# Patient Record
Sex: Male | Born: 1990 | Race: White | Hispanic: No | Marital: Single | Smoking: Never smoker
Health system: Southern US, Community
[De-identification: ages and names within clinical notes are randomized; demographics above are authoritative.]

---
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
## 2009-04-21 ENCOUNTER — Ambulatory Visit: Payer: Self-pay | Admitting: Diagnostic Radiology

## 2009-04-21 ENCOUNTER — Observation Stay (HOSPITAL_COMMUNITY): Admission: RE | Admit: 2009-04-21 | Discharge: 2009-04-22 | Payer: Self-pay | Admitting: Surgery

## 2009-04-21 ENCOUNTER — Encounter: Payer: Self-pay | Admitting: Emergency Medicine

## 2009-04-21 ENCOUNTER — Encounter (INDEPENDENT_AMBULATORY_CARE_PROVIDER_SITE_OTHER): Payer: Self-pay | Admitting: Surgery

## 2009-11-26 ENCOUNTER — Ambulatory Visit: Payer: Self-pay | Admitting: Family

## 2009-11-26 DIAGNOSIS — R109 Unspecified abdominal pain: Secondary | ICD-10-CM

## 2010-04-09 NOTE — Assessment & Plan Note (Signed)
Summary: new to est pain abd pain may be hernia bcbs/mhf--Rm 5   Vital Signs:  Patient profile:   20 year old male Height:      72.5 inches Weight:      188 pounds BMI:     25.24 Temp:     98.1 degrees F oral Pulse rate:   84 / minute Pulse rhythm:   regular Resp:     16 per minute BP sitting:   140 / 80  (right arm) Cuff size:   regular  Vitals Entered By: Mervin Kung CMA Duncan Dull) (November 26, 2009 3:22 PM) CC: Rm 5  New pt. States he has had abominal pain 3-4 days. Lifts weights. Is Patient Diabetic? No   CC:  Rm 5  New pt. States he has had abominal pain 3-4 days. Lifts weights..  History of Present Illness: Mr. Goin is a 20 year old male who presents today to establish care.  His chief complaint today is abdominal discomfort which start 3-4 days ago.  Discomfort started after he was lifting the groceries out of the car.  Has been lifting weights 3x a week- but has not lifted in 10 days. Denies nausea, vomitting diarrhea.  Discomfort is noted to be on the right side of his umbilicus and radiates down into the right groin.  Pain is made worse with movement, better with rest.  Has been using aleve PRN. Denies associatated fever.  He is s/p appendectomy 2/11.  Preventive Screening-Counseling & Management  Alcohol-Tobacco     Alcohol drinks/day: social     Smoking Status: never  Caffeine-Diet-Exercise     Caffeine use/day: rarely     Does Patient Exercise: yes     Type of exercise: weights, cardio     Times/week: 3  Allergies (verified): 1)  ! Claritin  Past History:  Past Medical History: Allergies: dust mites  Past Surgical History: Appendectomy--04/2009 ear tubes as infant  Family History: Mother--living, lupus Father--living, healthy 1 brother--healthy 1 sister--healthy  Social History: Occupation: Consulting civil engineer at [employer] Single Never Smoked Alcohol use-yes, -  once a month Regular exercise-yes Smoking Status:  never Caffeine use/day:  rarely Does Patient  Exercise:  yes  Review of Systems       Constitutional: Denies Fever ENT:  Denies nasal congestion or sore throat. Resp: Denies cough CV:  Denies Chest Pain GI:  Denies nausea or vomitting GU: Denies dysuria Lymphatic: Denies lymphadenopathy Musculoskeletal:  + tendinitis in L RTC Skin:  Denies Rashes  Psychiatric: Denies depression or anxiety Neuro: Denies numbness or weakness     Physical Exam  General:  Well-developed,well-nourished,in no acute distress; alert,appropriate and cooperative throughout examination Head:  Normocephalic and atraumatic without obvious abnormalities. No apparent alopecia or balding. Lungs:  Normal respiratory effort, chest expands symmetrically. Lungs are clear to auscultation, no crackles or wheezes. Heart:  Normal rate and regular rhythm. S1 and S2 normal without gallop, murmur, click, rub or other extra sounds. Abdomen:  Bowel sounds positive,abdomen soft and non-tender without masses, organomegaly or hernias noted.  No palpable inguinal hernias on exam.   Genitalia:  Testes bilaterally descended without nodularity, tenderness or masses. No scrotal masses or lesions. No penis lesions or urethral discharge.   Impression & Recommendations:  Problem # 1:  MUSCLE STRAIN, ABDOMINAL WALL (ICD-848.8) Assessment New Will treat conservatively with NSAIDS an no heavy lifting.  Patient to call if symptoms worsen or if they are not resolved in 1 week.  Will consider referral to surgery at that  time.    Complete Medication List: 1)  [facility] Allergy Med  .... Take 1 tablet by mouth once a day. 2)  Aleve 220 Mg Tabs (Naproxen sodium) .... Take 1 tablet by mouth twice daily as needed  Patient Instructions: 1)  Call if your symptoms worsen or if they are not resolved in 1 week. 2)  Please schedule a complete physical at your convenience.  Current Allergies (reviewed today): ! CLARITIN

## 2010-05-28 LAB — URINALYSIS, ROUTINE W REFLEX MICROSCOPIC
Bilirubin Urine: NEGATIVE
Ketones, ur: >80 mg/dL — AB
Nitrite: NEGATIVE
Specific Gravity, Urine: 1.031 — ABNORMAL HIGH (ref 1.005–1.030)
Urobilinogen, UA: 1 mg/dL (ref 0.0–1.0)
pH: 6 (ref 5.0–8.0)

## 2010-05-28 LAB — COMPREHENSIVE METABOLIC PANEL
AST: 17 U/L (ref 0–37)
Albumin: 4.5 g/dL (ref 3.5–5.2)
BUN: 20 mg/dL (ref 6–23)
CO2: 26 mEq/L (ref 19–32)
Creatinine, Ser: 0.9 mg/dL (ref 0.4–1.5)
GFR calc Af Amer: >60 mL/min (ref >60)
Glucose, Bld: 114 mg/dL — ABNORMAL HIGH (ref 70–99)
Potassium: 3.9 mEq/L (ref 3.5–5.1)
Sodium: 142 mEq/L (ref 135–145)
Total Protein: 7.4 g/dL (ref 6.0–8.3)

## 2010-05-28 LAB — DIFFERENTIAL
Basophils Absolute: 0.1 10*3/uL (ref 0.0–0.1)
Eosinophils Relative: 0 % (ref 0–5)
Lymphocytes Relative: 8 % — ABNORMAL LOW (ref 12–46)
Monocytes Absolute: 0.6 10*3/uL (ref 0.1–1.0)
Monocytes Relative: 4 % (ref 3–12)

## 2010-05-28 LAB — CBC
HCT: 44.9 % (ref 39.0–52.0)
Hemoglobin: 15.5 g/dL (ref 13.0–17.0)
Platelets: 172 10*3/uL (ref 150–400)
RBC: 5.08 MIL/uL (ref 4.22–5.81)

## 2011-03-09 HISTORY — PX: APPENDECTOMY: SHX54

## 2015-05-27 ENCOUNTER — Ambulatory Visit (INDEPENDENT_AMBULATORY_CARE_PROVIDER_SITE_OTHER): Payer: Managed Care, Other (non HMO) | Admitting: Primary Care

## 2015-05-27 ENCOUNTER — Encounter: Payer: Self-pay | Admitting: Primary Care

## 2015-05-27 VITALS — BP 132/82 | HR 72 | Temp 98.1°F | Ht 72.0 in | Wt 247.8 lb

## 2015-05-27 DIAGNOSIS — Z008 Encounter for other general examination: Secondary | ICD-10-CM

## 2015-05-27 NOTE — Progress Notes (Signed)
Pre visit review using our clinic review tool, if applicable. No additional management support is needed unless otherwise documented below in the visit note. 

## 2015-05-27 NOTE — Progress Notes (Signed)
Subjective:    Patient ID: Phillip Torres, male    DOB: 1991-01-10, 25 y.o.   MRN: [id number]  HPI  Phillip Torres is a 25 year old male who presents today to establish care and discuss the problems mentioned below. Will obtain old records.  1) Testicular Exam: Has noticed a mass located to his right testicle for the past 1 year. He's not noticed enlarging, redness, pain. Denies FH of testicular cancer or any other cancers. He is in a monogamous relationship with his girlfriend for 1 year. Denies penile discharge, itching, genital warts, dysuria, hematuria. He's unsure if he's really feeling anything but is concerned as he recently lost a friend to testicular cancer. He is requesting a formal examination today.  Review of Systems  Constitutional: Negative for unexpected weight change.  HENT: Negative for rhinorrhea.   Respiratory: Negative for cough and shortness of breath.   Cardiovascular: Negative for chest pain.  Gastrointestinal: Negative for diarrhea and constipation.  Genitourinary: Negative for dysuria, frequency, discharge, difficulty urinating and penile pain.  Musculoskeletal: Negative for myalgias and arthralgias.  Skin: Negative for rash.  Allergic/Immunologic: Positive for environmental allergies.  Neurological: Negative for numbness and headaches.  Psychiatric/Behavioral:       History of anxiety in the past, once on medication 1 year ago but could not tolerate. Overall feeling he can manage.       No past medical history on file.  Social History   Social History  . Marital Status: Single    Spouse Name: N/A  . Number of Children: N/A  . Years of Education: N/A   Occupational History  . Not on file.   Social History Main Topics  . Smoking status: Never Smoker   . Smokeless tobacco: Not on file  . Alcohol Use: No  . Drug Use: No  . Sexual Activity: Not on file   Other Topics Concern  . Not on file   Social History Narrative   Single.   Works as an Systems developer.     Lives in [place].   Enjoys relaxing.     Past Surgical History  Procedure Laterality Date  . Appendectomy  2013    Family History  Problem Relation Age of Onset  . Lupus Mother     Allergies  Allergen Reactions  . Loratadine     REACTION: headache    No current outpatient prescriptions on file prior to visit.   No current facility-administered medications on file prior to visit.    BP 132/82 mmHg  Pulse 72  Temp(Src) 98.1 F (36.7 C) (Oral)  Ht 6' (1.829 m)  Wt 247 lb 12.8 oz (112.401 kg)  BMI 33.60 kg/m2  SpO2 97%    Objective:   Physical Exam  Constitutional: He appears well-nourished.  Cardiovascular: Normal rate and regular rhythm.   Pulmonary/Chest: Effort normal and breath sounds normal.  Abdominal: Hernia confirmed negative in the right inguinal area and confirmed negative in the left inguinal area.  Genitourinary: Cremasteric reflex is present. Right testis shows no mass, no swelling and no tenderness. Left testis shows no mass, no swelling and no tenderness. Circumcised. No penile tenderness. No discharge found.  Chaperone present during exam.  Skin: Skin is warm and dry.          Assessment & Plan:  Testicular Exam:  Presents today wanting formal exam to testicles. He's unsure if he's noticed a mass or not, but is concerned as his friend was diagnosed and passed away from  testicular cancer. No FH of cancers. Exam unremarkable. No swelling, masses, erythema, tenderness. Suspect he's feeling the vas deferens/spermatic cord. Discussed anatomy of the testicles and provided reassurance that this was a normal finding. Offered Ultrasound to confirm my exam, he declines at this time and will do self testicular exams at home. Information provided regarding self exams. Return precautions provided.

## 2015-05-27 NOTE — Patient Instructions (Signed)
Your exam was normal today.   If you notice any changes (swelling, changes in size, pain, or redness) please call me immediately and we can set up the Ultrasound as discussed.  It was a pleasure to meet you today! Please don't hesitate to call me with any questions. Welcome to [facility]!  Testicular Self-Exam A self-exam of your testicles is looking at and feeling your testicles for abnormal lumps or swelling. Several things can cause swelling, lumps, or pain in your testicles. Some of these causes are:  Injuries.  Puffiness, redness, and soreness (inflammation).  Infection.  Extra fluids around your testicle.  Twisted testicles.  Testicular cancer. The testicles are easiest to check after warm baths or showers. They are harder to examine when you are cold.  Follow these steps while you are standing:  Hold your penis away from your body.  Roll one testicle between your thumb and finger. Feel the entire testicle.  Roll the other testicle between your thumb and finger. Feel the entire testicle. Feel for lumps, swelling, or discomfort. A normal testicle is egg shaped. It feels firm. It is smooth and not tender. The spermatic cord feels like a firm spaghetti-like cord. It is at the back of your testicle. Examine the crease between the front of your leg and your abdomen. Feel for any bumps that are tender. These could be enlarged lymph nodes.    This information is not intended to replace advice given to you by your health care provider. Make sure you discuss any questions you have with your health care provider.   Document Released: 05/21/2008 Document Revised: 12/13/2012 Document Reviewed: 08/14/2012 Elsevier Interactive Patient Education [employer]2016 Elsevier Inc.

## 2015-08-26 ENCOUNTER — Encounter (HOSPITAL_BASED_OUTPATIENT_CLINIC_OR_DEPARTMENT_OTHER): Payer: Self-pay | Admitting: Emergency Medicine

## 2015-08-26 ENCOUNTER — Emergency Department (HOSPITAL_BASED_OUTPATIENT_CLINIC_OR_DEPARTMENT_OTHER): Payer: Managed Care, Other (non HMO)

## 2015-08-26 ENCOUNTER — Emergency Department (HOSPITAL_BASED_OUTPATIENT_CLINIC_OR_DEPARTMENT_OTHER)
Admission: EM | Admit: 2015-08-26 | Discharge: 2015-08-26 | Disposition: A | Discharge status: Home / Self Care | Payer: Managed Care, Other (non HMO) | Attending: Emergency Medicine | Admitting: Emergency Medicine

## 2015-08-26 ENCOUNTER — Other Ambulatory Visit: Payer: Self-pay

## 2015-08-26 DIAGNOSIS — R0789 Other chest pain: Secondary | ICD-10-CM | POA: Insufficient documentation

## 2015-08-26 DIAGNOSIS — R079 Chest pain, unspecified: Secondary | ICD-10-CM

## 2015-08-26 LAB — BASIC METABOLIC PANEL
Anion gap: 9 (ref 5–15)
BUN: 18 mg/dL (ref 6–20)
CALCIUM: 9.2 mg/dL (ref 8.9–10.3)
CO2: 24 mmol/L (ref 22–32)
CREATININE: 1.01 mg/dL (ref 0.61–1.24)
Chloride: 104 mmol/L (ref 101–111)
GFR calc Af Amer: >60 mL/min (ref >60)
GFR calc non Af Amer: >60 mL/min (ref >60)
Glucose, Bld: 108 mg/dL — ABNORMAL HIGH (ref 65–99)
POTASSIUM: 4.1 mmol/L (ref 3.5–5.1)
SODIUM: 137 mmol/L (ref 135–145)

## 2015-08-26 LAB — CBC WITH DIFFERENTIAL/PLATELET
Basophils Absolute: 0 10*3/uL (ref 0.0–0.1)
Basophils Relative: 0 %
EOS ABS: 0 10*3/uL (ref 0.0–0.7)
EOS PCT: 1 %
HCT: 42.4 % (ref 39.0–52.0)
Hemoglobin: 14.5 g/dL (ref 13.0–17.0)
LYMPHS ABS: 1.2 10*3/uL (ref 0.7–4.0)
Lymphocytes Relative: 19 %
MCH: 28.9 pg (ref 26.0–34.0)
MCHC: 34.2 g/dL (ref 30.0–36.0)
MCV: 84.5 fL (ref 78.0–100.0)
Monocytes Absolute: 0.5 10*3/uL (ref 0.1–1.0)
Monocytes Relative: 7 %
Neutro Abs: 4.6 10*3/uL (ref 1.7–7.7)
Neutrophils Relative %: 73 %
PLATELETS: 164 10*3/uL (ref 150–400)
RBC: 5.02 MIL/uL (ref 4.22–5.81)
RDW: 12.4 % (ref 11.5–15.5)
WBC: 6.4 10*3/uL (ref 4.0–10.5)

## 2015-08-26 LAB — D-DIMER, QUANTITATIVE: D-Dimer, Quant: <0.27 ug/mL-FEU (ref 0.00–0.50)

## 2015-08-26 LAB — TROPONIN I: Troponin I: <0.03 ng/mL (ref <0.031)

## 2015-08-26 NOTE — ED Notes (Signed)
Chest pressure x 3 days 

## 2015-08-26 NOTE — Discharge Instructions (Signed)
°  We did not find serious cause of your symptoms. Return without fail for worsening symptoms, including passing out  Nonspecific Chest Pain It is often hard to find the cause of chest pain. There is always a chance that your pain could be related to something serious, such as a heart attack or a blood clot in your lungs. Chest pain can also be caused by conditions that are not life-threatening. If you have chest pain, it is very important to follow up with your doctor.  HOME CARE  If you were prescribed an antibiotic medicine, finish it all even if you start to feel better.  Avoid any activities that cause chest pain.  Do not use any tobacco products, including cigarettes, chewing tobacco, or electronic cigarettes. If you need help quitting, ask your doctor.  Do not drink alcohol.  Take medicines only as told by your doctor.  Keep all follow-up visits as told by your doctor. This is important. This includes any further testing if your chest pain does not go away.  Your doctor may tell you to keep your head raised (elevated) while you sleep.  Make lifestyle changes as told by your doctor. These may include:  Getting regular exercise. Ask your doctor to suggest some activities that are safe for you.  Eating a heart-healthy diet. Your doctor or a diet specialist (dietitian) can help you to learn healthy eating options.  Maintaining a healthy weight.  Managing diabetes, if necessary.  Reducing stress. GET HELP IF:  Your chest pain does not go away, even after treatment.  You have a rash with blisters on your chest.  You have a fever. GET HELP RIGHT AWAY IF:  Your chest pain is worse.  You have an increasing cough, or you cough up blood.  You have severe belly (abdominal) pain.  You feel extremely weak.  You pass out (faint).  You have chills.  You have sudden, unexplained chest discomfort.  You have sudden, unexplained discomfort in your arms, back, neck, or  jaw.  You have shortness of breath at any time.  You suddenly start to sweat, or your skin gets clammy.  You feel nauseous.  You vomit.  You suddenly feel light-headed or dizzy.  Your heart begins to beat quickly, or it feels like it is skipping beats. These symptoms may be an emergency. Do not wait to see if the symptoms will go away. Get medical help right away. Call your local emergency services (911 in the [place] Do not drive yourself to the hospital.   This information is not intended to replace advice given to you by your health care provider. Make sure you discuss any questions you have with your health care provider.   Document Released: 08/11/2007 Document Revised: 03/15/2014 Document Reviewed: 09/28/2013 Elsevier Interactive Patient Education [employer]2016 Elsevier Inc.

## 2015-08-26 NOTE — ED Notes (Signed)
MD at bedside. 

## 2015-08-26 NOTE — ED Notes (Signed)
Pt reports intermittent chest pressure x 2 days. Pt describes pain as heaviness, non radiating. Pt states pain began while working at his desk. Pt denies SOB, N/v, diaphoresis.

## 2015-08-26 NOTE — ED Provider Notes (Signed)
CSN: [id number]     Arrival date & time 08/26/15  1240 History   First MD Initiated Contact with Patient 08/26/15 1342     Chief Complaint  Patient presents with  . Chest Pain     (Consider location/radiation/quality/duration/timing/severity/associated sxs/prior Treatment) HPI 25 year old male who presents with chest pressure. He is otherwise healthy. States onset of chest pressure 3 days ago while at rest. States that pain has been constant, occasionally getting worse in severe waves. Associated with some shortness of breath and lightheadedness. No dyspnea on exertion, lower extremity edema or pain, fevers, cough, congestion, abdominal pain, nausea, vomiting, or diaphoresis. States that symptoms initially seemed to be improved with activity such as walking. Has not had similar symptoms before in the past. Pain not pleuritic. No recent immobilization. Mother with factor V leiden, SLE and recurrent history of DVT/PE. He has never been tested or had issues.  History reviewed. No pertinent past medical history. Past Surgical History  Procedure Laterality Date  . Appendectomy  2013   Family History  Problem Relation Age of Onset  . Lupus Mother    Social History  Substance Use Topics  . Smoking status: Never Smoker   . Smokeless tobacco: None  . Alcohol Use: No    Review of Systems 10/14 systems reviewed and are negative other than those stated in the HPI   Allergies  Loratadine  Home Medications   Prior to Admission medications   Not on File   BP 127/78 mmHg  Pulse 86  Temp(Src) 97.9 F (36.6 C) (Oral)  Resp 18  Ht 6' (1.829 m)  Wt 262 lb (118.842 kg)  BMI 35.53 kg/m2  SpO2 98% Physical Exam Physical Exam  Nursing note and vitals reviewed. Constitutional: Well developed, well nourished, non-toxic, and in no acute distress Head: Normocephalic and atraumatic.  Mouth/Throat: Oropharynx is clear and moist.  Neck: Normal range of motion. Neck supple.  Cardiovascular:  Normal rate and regular rhythm.   Pulmonary/Chest: Effort normal and breath sounds normal.  Abdominal: Soft. There is no tenderness. There is no rebound and no guarding.  Musculoskeletal: Normal range of motion.  Neurological: Alert, no facial droop, fluent speech, moves all extremities symmetrically Skin: Skin is warm and dry.  Psychiatric: Cooperative  ED Course  Procedures (including critical care time) Labs Review Labs Reviewed  BASIC METABOLIC PANEL - Abnormal; Notable for the following:    Glucose, Bld 108 (*)    All other components within normal limits  CBC WITH DIFFERENTIAL/PLATELET  TROPONIN I  D-DIMER, QUANTITATIVE (NOT AT [facility])    Imaging Review Dg Chest 2 View  08/26/2015  CLINICAL DATA:  Bilateral upper chest pain and pressure for 3 days. EXAM: CHEST  2 VIEW COMPARISON:  Overlapping portions of CT abdomen 04/21/2009 FINDINGS: The heart size and mediastinal contours are within normal limits. Both lungs are clear. The visualized skeletal structures are unremarkable. IMPRESSION: No active cardiopulmonary disease. Electronically Signed   By: Gaylyn RongWalter  Liebkemann M.D.   On: 08/26/2015 14:12   I have personally reviewed and evaluated these images and lab results as part of my medical decision-making.   EKG Interpretation   Date/Time:  Tuesday August 26 2015 12:44:05 EDT Ventricular Rate:  92 PR Interval:  136 QRS Duration: 96 QT Interval:  336 QTC Calculation: 415 R Axis:   30 Text Interpretation:  Normal sinus rhythm Normal ECG No heart strain,  stigmata of arrhythmia or ischemic changes.  Confirmed by Susannah Carbin MD, Jamai Dolce  [phone]) on 08/26/2015 2:01:09  PM Also confirmed by Siarah Deleo MD, Sahily Biddle [phone]),  editor Wandalee Ferdinand [phone])  on 08/26/2015 3:32:42 PM      MDM   Final diagnoses:  Nonspecific chest pain    25 year old male who presents with nonspecific chest pressure. He is well-appearing and in no acute distress with normal vital signs. Cardiopulmonary exam otherwise  unremarkable. His EKG is normal, without heart strain, stigmata of arrhythmia, or ischemic changes. He is a normal troponin, and presentation not suspicious for that of ACS. D-dimer is negative and he is felt ruled out for PE. Chest x-ray showing no acute cardiopulmonary processes. Low suspicion at this time for emergent or life-threatening intrathoracic or cardiopulmonary process. Discussed continued supportive care for home. Strict return and follow-up instructions are reviewed. He expressed understanding of all discharge instructions, and felt comfortable with the plan of care    Lavera Guise, MD 08/26/15 1710

## 2015-08-28 ENCOUNTER — Encounter: Payer: Self-pay | Admitting: Primary Care

## 2015-08-28 ENCOUNTER — Ambulatory Visit (INDEPENDENT_AMBULATORY_CARE_PROVIDER_SITE_OTHER): Payer: Managed Care, Other (non HMO) | Admitting: Primary Care

## 2015-08-28 VITALS — BP 130/80 | HR 67 | Temp 98.1°F | Ht 72.0 in | Wt 254.4 lb

## 2015-08-28 DIAGNOSIS — R0789 Other chest pain: Secondary | ICD-10-CM

## 2015-08-28 NOTE — Patient Instructions (Signed)
Your symptoms could be related to something called costochondritis or from allergies.  Start Zyrtec at bedtime to help dry up throat drainage. This will also reduce your cough.  Start Ibuprofen 600 mg three times daily as needed for chest wall pain.  If you develop burning to your throat or pain to the lower chest the try Zantac 150 mg once daily for 2 weeks.  Keep me updated if no improvement.  It was a pleasure to see you today!  Costochondritis Costochondritis, sometimes called Tietze syndrome, is a swelling and irritation (inflammation) of the tissue (cartilage) that connects your ribs with your breastbone (sternum). It causes pain in the chest and rib area. Costochondritis usually goes away on its own over time. It can take up to 6 weeks or longer to get better, especially if you are unable to limit your activities. CAUSES  Some cases of costochondritis have no known cause. Possible causes include:  Injury (trauma).  Exercise or activity such as lifting.  Severe coughing. SIGNS AND SYMPTOMS  Pain and tenderness in the chest and rib area.  Pain that gets worse when coughing or taking deep breaths.  Pain that gets worse with specific movements. DIAGNOSIS  Your health care provider will do a physical exam and ask about your symptoms. Chest X-rays or other tests may be done to rule out other problems. TREATMENT  Costochondritis usually goes away on its own over time. Your health care provider may prescribe medicine to help relieve pain. HOME CARE INSTRUCTIONS   Avoid exhausting physical activity. Try not to strain your ribs during normal activity. This would include any activities using chest, abdominal, and side muscles, especially if heavy weights are used.  Apply ice to the affected area for the first 2 days after the pain begins.  Put ice in a plastic bag.  Place a towel between your skin and the bag.  Leave the ice on for 20 minutes, 2-3 times a day.  Only take  over-the-counter or prescription medicines as directed by your health care provider. SEEK MEDICAL CARE IF:  You have redness or swelling at the rib joints. These are signs of infection.  Your pain does not go away despite rest or medicine. SEEK IMMEDIATE MEDICAL CARE IF:   Your pain increases or you are very uncomfortable.  You have shortness of breath or difficulty breathing.  You cough up blood.  You have worse chest pains, sweating, or vomiting.  You have a fever or persistent symptoms for more than 2-3 days.  You have a fever and your symptoms suddenly get worse. MAKE SURE YOU:   Understand these instructions.  Will watch your condition.  Will get help right away if you are not doing well or get worse.   This information is not intended to replace advice given to you by your health care provider. Make sure you discuss any questions you have with your health care provider.   Document Released: 12/02/2004 Document Revised: 12/13/2012 Document Reviewed: 09/26/2012 Elsevier Interactive Patient Education [employer]2016 Elsevier Inc.

## 2015-08-28 NOTE — Progress Notes (Signed)
Subjective:    Patient ID: Phillip Torres, male    DOB: Jul 24, 1990, 25 y.o.   MRN: [id number]  HPI  Phillip Torres is a 25 year old male who presents today for emergency department follow up. [facility]20 with complaints of chest pressure. His chest pressure began 3 days prior while at rest which had gradually become worse and constant at that point.  During his stay in the ED he underwent evaluation with ECG (NSR, rate of 92, normal axis, unremarkable), Troponin (unremarkable), D-Dimer (unremarkable), chest xray (unremarkable). He was discharged home later that day.  Since his evaluation in the ED he continues to experience intermittent chest wall pressure across the entire chest. He was recently ill with congestion that began several days ago with mild cough, post nasal drip. Denies increased stress or anxiety, nausea, diaphoresis, esophageal burning, wheezing, strenuous activity. He feels as though he has to take in a deep breath to get a complete breath.   Review of Systems  Constitutional: Negative for fever and chills.  HENT: Positive for congestion, postnasal drip and sinus pressure.   Respiratory: Positive for cough. Negative for shortness of breath.   Cardiovascular: Positive for chest pain. Negative for palpitations.  Gastrointestinal: Negative for nausea and abdominal pain.  Neurological: Negative for headaches.       No past medical history on file.   Social History   Social History  . Marital Status: Single    Spouse Name: N/A  . Number of Children: N/A  . Years of Education: N/A   Occupational History  . Not on file.   Social History Main Topics  . Smoking status: Never Smoker   . Smokeless tobacco: Not on file  . Alcohol Use: No  . Drug Use: No  . Sexual Activity: Not on file   Other Topics Concern  . Not on file   Social History Narrative   Single.   Works as an Systems developerAnalyst.   [place].   Enjoys relaxing.     Past  Surgical History  Procedure Laterality Date  . Appendectomy  2013    Family History  Problem Relation Age of Onset  . Lupus Mother     Allergies  Allergen Reactions  . Loratadine     REACTION: headache    No current outpatient prescriptions on file prior to visit.   No current facility-administered medications on file prior to visit.    BP 130/80 mmHg  Pulse 67  Temp(Src) 98.1 F (36.7 C) (Oral)  Ht 6' (1.829 m)  Wt 254 lb 6.4 oz (115.395 kg)  BMI 34.50 kg/m2  SpO2 98%    Objective:   Physical Exam  Constitutional: He appears well-nourished. He does not appear ill.  HENT:  Right Ear: Tympanic membrane and ear canal normal.  Left Ear: Tympanic membrane and ear canal normal.  Nose: No mucosal edema. Right sinus exhibits no maxillary sinus tenderness and no frontal sinus tenderness. Left sinus exhibits no maxillary sinus tenderness and no frontal sinus tenderness.  Mouth/Throat: Oropharynx is clear and moist.  Eyes: Conjunctivae are normal.  Neck: Neck supple.  Cardiovascular: Normal rate and regular rhythm.   Pulmonary/Chest: Effort normal and breath sounds normal. He has no wheezes. He has no rales.  Abdominal: Soft. There is no tenderness.  Musculoskeletal:  Chest wall pain reproducible upon palpation  Skin: Skin is warm and dry.          Assessment &  Plan:  Emergency Department Follow Up:  [facility]. ED work up completely negative.  Continues to experience chest pressure. Does not appear acutely ill, respiratory exam unremarkable. Chest wall pain reproducible on palpation. Suspect either mild costochondritis. Could be MSK related. Discussed use of Zyrtec to decrease PND. Ibuprofen TID PRN. Discussed to use Zantac daily if reflux symptoms begin. He is to follow up if no improvement.  All ED notes, imaging, labs reviewed.  Morrie Sheldonlark,Arval Brandstetter Kendal, NP

## 2015-08-28 NOTE — Progress Notes (Signed)
Pre visit review using our clinic review tool, if applicable. No additional management support is needed unless otherwise documented below in the visit note. 

## 2016-03-10 ENCOUNTER — Ambulatory Visit (INDEPENDENT_AMBULATORY_CARE_PROVIDER_SITE_OTHER): Payer: Self-pay | Admitting: Primary Care

## 2016-03-10 ENCOUNTER — Encounter: Payer: Self-pay | Admitting: Primary Care

## 2016-03-10 VITALS — BP 144/80 | HR 71 | Temp 98.0°F | Ht 72.0 in | Wt 261.8 lb

## 2016-03-10 DIAGNOSIS — L409 Psoriasis, unspecified: Secondary | ICD-10-CM | POA: Insufficient documentation

## 2016-03-10 DIAGNOSIS — R1030 Lower abdominal pain, unspecified: Secondary | ICD-10-CM

## 2016-03-10 LAB — COMPREHENSIVE METABOLIC PANEL
ALT: 19 U/L (ref 0–53)
AST: 16 U/L (ref 0–37)
Albumin: 4.8 g/dL (ref 3.5–5.2)
Alkaline Phosphatase: 73 U/L (ref 39–117)
BUN: 16 mg/dL (ref 6–23)
CALCIUM: 9.8 mg/dL (ref 8.4–10.5)
CHLORIDE: 100 meq/L (ref 96–112)
CO2: 31 meq/L (ref 19–32)
Creatinine, Ser: 0.91 mg/dL (ref 0.40–1.50)
GFR: 106.95 mL/min (ref >60.00)
Glucose, Bld: 96 mg/dL (ref 70–99)
POTASSIUM: 4 meq/L (ref 3.5–5.1)
Sodium: 138 mEq/L (ref 135–145)
Total Bilirubin: 1.1 mg/dL (ref 0.2–1.2)
Total Protein: 7.4 g/dL (ref 6.0–8.3)

## 2016-03-10 LAB — POC URINALSYSI DIPSTICK (AUTOMATED)
Bilirubin, UA: NEGATIVE
Blood, UA: NEGATIVE
GLUCOSE UA: NEGATIVE
KETONES UA: NEGATIVE
LEUKOCYTES UA: NEGATIVE
Nitrite, UA: NEGATIVE
Protein, UA: NEGATIVE
SPEC GRAV UA: 1.02
Urobilinogen, UA: NEGATIVE
pH, UA: 7

## 2016-03-10 LAB — CBC
HEMATOCRIT: 44.2 % (ref 39.0–52.0)
Hemoglobin: 15.4 g/dL (ref 13.0–17.0)
MCHC: 34.8 g/dL (ref 30.0–36.0)
MCV: 83.8 fl (ref 78.0–100.0)
PLATELETS: 184 10*3/uL (ref 150.0–400.0)
RBC: 5.27 Mil/uL (ref 4.22–5.81)
RDW: 13 % (ref 11.5–15.5)
WBC: 5.5 10*3/uL (ref 4.0–10.5)

## 2016-03-10 NOTE — Assessment & Plan Note (Signed)
Follows with dermatology. Present to scalp, and also appears to cheeks as well. Does not represent typical lupus rash, no other systemic symptoms for lupus present. Will have him continue to monitor and follow up with dermatology for further evaluation.

## 2016-03-10 NOTE — Progress Notes (Signed)
Pre visit review using our clinic review tool, if applicable. No additional management support is needed unless otherwise documented below in the visit note. 

## 2016-03-10 NOTE — Progress Notes (Signed)
Subjective:    Patient ID: Charma Igo, male    DOB: 1990/12/18, 26 y.o.   MRN: [id number]  HPI  Mr. Rembold is a 26 year old male who presents today with multiple complaints.  1) Abdominal Pain: Located to bilateral lower abdomen, bilateral groin (mostly right) that has been present for the past 3-4 weeks. He was carrying a garbage bag when his pain first began. He's also had bilateral back pain. He denies nausea, vomiting, diarrhea, changes in appetite, dysuria, hematuria, frequency. He's taken ibuprofen without much improvement.  He was evaluated in 2011 for complaints of right groin and abdominal pain that occurred after lifting heavy groceries and weights. His exam was unremarkable and was treated with NSAIDS and rest.   He does have a history of appendectomy in 2013.   2) Rash: History of psoriasis to his scalp. Family history of Lupus in his mother. His rash is located to his bilateral cheeks that he first noticed 1 year ago. He will apply cortisone cream with improvement. He currently follows with Dermatology who didn't mention anything about his facial rash during his last visit.  3) Testicular Mass: He has found a lump to his right testicle that he noticed 2 weeks ago. He denies redness/swelling/pain his testicles. He denies unexplained weight loss, FH of testicular cancer, penile discharge. He was evaluated in March 2017 with same complaints. During that visit there was no evidence of lump or other abnormality.  Review of Systems  Constitutional: Negative for fatigue and fever.  Gastrointestinal: Positive for abdominal pain. Negative for blood in stool, constipation, diarrhea, nausea and vomiting.  Genitourinary: Negative for dysuria, frequency, hematuria, penile swelling, scrotal swelling and testicular pain.  Psychiatric/Behavioral:       History of chest pain that was diagnosed as anxiety. Normal work up in ED.       No past medical history on file.   Social History    Social History  . Marital status: Single    Spouse name: N/A  . Number of children: N/A  . Years of education: N/A   Occupational History  . Not on file.   Social History Main Topics  . Smoking status: Never Smoker  . Smokeless tobacco: Not on file  . Alcohol use No  . Drug use: No  . Sexual activity: Not on file   Other Topics Concern  . Not on file   Social History Narrative   Single.   Works as an Systems developer.   Lives in [place].   Enjoys relaxing.     Past Surgical History:  Procedure Laterality Date  . APPENDECTOMY  2013    Family History  Problem Relation Age of Onset  . Lupus Mother     Allergies  Allergen Reactions  . Loratadine     REACTION: headache    No current outpatient prescriptions on file prior to visit.   No current facility-administered medications on file prior to visit.     BP (!) 144/80   Pulse 71   Temp 98 F (36.7 C) (Oral)   Ht 6' (1.829 m)   Wt 261 lb 12.8 oz (118.8 kg)   SpO2 96%   BMI 35.51 kg/m    Objective:   Physical Exam  Constitutional: He appears well-nourished.  Neck: Neck supple.  Cardiovascular: Normal rate and regular rhythm.   Pulmonary/Chest: Effort normal and breath sounds normal.  Abdominal: Soft. Bowel sounds are normal. There is tenderness in the left lower quadrant. There is  no guarding, no CVA tenderness and negative Murphy's sign. No hernia. Hernia confirmed negative in the right inguinal area.  Genitourinary: Testes normal and penis normal. Right testis shows no mass, no swelling and no tenderness. Left testis shows no mass, no swelling and no tenderness.  Genitourinary Comments: Chaperone present.  Skin: Skin is warm and dry.  Mild rash to bilateral cheeks. No scaling. Scaling noted to scalp.  Psychiatric:  Does seem anxious about various symptoms          Assessment & Plan:  Testicular Mass:  Second complaint since March 2017 for testicular mass. Exam today without evidence of mass or  other GU abnormality. Reassurance provided, discussed normal anatomy of scrotal sac and testicles. He could have felt the spermatic cord as this was palpable today. Suspect some underlying anxiety which may need addressed in the future. Discussed to continue self testicular exams and report if any changes.  Morrie Sheldonlark,Mauria Asquith Kendal, NP

## 2016-03-10 NOTE — Assessment & Plan Note (Signed)
Lower quadrants bilaterally, also with right groin pain. This is the second visit for this complaint since 2012. Exam today without evidence of inguinal or obvious abdominal hernia. UA, CMP, CBC today unremarkable.  Given labs, lack of other symptoms, and examination, do not suspect renal stone, renal involvement, UTI, inguinal hernia, abdominal hernia, infectious process.  Could be experiencing scar tissue from appendectomy in 2013. Also suspect could be anxiety. Will offer abdominal ultrasound of area of concern for further evaluation.

## 2016-03-10 NOTE — Patient Instructions (Addendum)
Complete lab work prior to leaving today.   There are no lumps on the testicles. There was no evidence for hernia today.  We will be in touch tomorrow. It was a pleasure to see you today!

## 2016-03-22 ENCOUNTER — Other Ambulatory Visit: Payer: Self-pay | Admitting: Primary Care

## 2016-03-22 ENCOUNTER — Telehealth: Payer: Self-pay | Admitting: Primary Care

## 2016-03-22 DIAGNOSIS — R1030 Lower abdominal pain, unspecified: Secondary | ICD-10-CM

## 2016-03-22 NOTE — Telephone Encounter (Signed)
Patient returned Chan's call. °

## 2016-03-22 NOTE — Telephone Encounter (Signed)
Spoken and notified patient of Kate's comments. Patient verbalized understanding. 

## 2016-04-19 ENCOUNTER — Ambulatory Visit
Admission: RE | Admit: 2016-04-19 | Discharge: 2016-04-19 | Disposition: A | Discharge status: Home / Self Care | Payer: 59 | Source: Ambulatory Visit | Attending: Primary Care | Admitting: Primary Care

## 2016-04-19 ENCOUNTER — Other Ambulatory Visit: Payer: Self-pay | Admitting: Primary Care

## 2016-04-19 DIAGNOSIS — R1030 Lower abdominal pain, unspecified: Secondary | ICD-10-CM

## 2016-04-28 ENCOUNTER — Encounter: Payer: Self-pay | Admitting: *Deleted

## 2016-11-22 ENCOUNTER — Ambulatory Visit (INDEPENDENT_AMBULATORY_CARE_PROVIDER_SITE_OTHER): Payer: 59

## 2016-11-22 ENCOUNTER — Encounter: Payer: Self-pay | Admitting: Family Medicine

## 2016-11-22 ENCOUNTER — Ambulatory Visit (INDEPENDENT_AMBULATORY_CARE_PROVIDER_SITE_OTHER): Payer: 59 | Admitting: Family Medicine

## 2016-11-22 VITALS — BP 126/76 | HR 74 | Ht 72.0 in | Wt 231.0 lb

## 2016-11-22 DIAGNOSIS — R1031 Right lower quadrant pain: Secondary | ICD-10-CM

## 2016-11-22 NOTE — Patient Instructions (Signed)
Thank you for coming in today. Attend PT.  Get xray today.  Recheck in 6 weeks or sooner if needed.  I am concerned about a sports hernia.

## 2016-11-22 NOTE — Progress Notes (Signed)
Phillip Torres is a 26 y.o. male who presents to [facility]: Primary Care Sports Medicine today for right groin pain.  Patient presents to clinic today for persistent right sided abdominal and groin pain. This pain is intermittent in nature but ongoing now for over a year. He cannot think of any exacerbating or alleviating factors. He has had workup with his primary care provider in the past with a normal pelvic ultrasound and normal lab work. He denies any vomiting or diarrhea urinary frequency urgency or dysuria. He notes the pain is predominantly solid in the right groin and radiates to the right testicle.   No past medical history on file. Past Surgical History:  Procedure Laterality Date  . APPENDECTOMY  2013   Social History  Substance Use Topics  . Smoking status: Never Smoker  . Smokeless tobacco: Never Used  . Alcohol use No   family history includes Lupus in his mother.  ROS as above: No headache, visual changes, nausea, vomiting, diarrhea, constipation, dizziness, abdominal pain, skin rash, fevers, chills, night sweats, weight loss, swollen lymph nodes, body aches, joint swelling, muscle aches, chest pain, shortness of breath, mood changes, visual or auditory hallucinations.    Medications: No current outpatient prescriptions on file.   No current facility-administered medications for this visit.    Allergies  Allergen Reactions  . Loratadine     REACTION: headache    Health Maintenance Health Maintenance  Topic Date Due  . HIV Screening  01/28/2005  . TETANUS/TDAP  01/28/2009  . INFLUENZA VACCINE  10/06/2016     Exam:  BP 126/76   Pulse 74   Ht 6' (1.829 m)   Wt 231 lb (104.8 kg)   BMI 31.33 kg/m  Gen: Well NAD HEENT: EOMI,  MMM Lungs: Normal work of breathing. CTABL Heart: RRR no MRG Abd: NABS, Soft. Nondistended, Nontender Exts: Brisk capillary refill, warm  and well perfused.  MSK: Abdominal wall is mildly tender to palpation at the right lower pelvic area and especially at the lateral pubic bone. He has some pain with abdominal wall contraction and abduct resisted abdominal wall motion. Hip exam hips bilaterally have normal motion in her nontender with normal strength. Genital: Testicles are descended bilaterally and nontender. No inguinal hernia present. No penile discharge or lesions.  X-ray pelvis pending    Chemistry      Component Value Date/Time   NA 138 03/10/2016 1535   K 4.0 03/10/2016 1535   CL 100 03/10/2016 1535   CO2 31 03/10/2016 1535   BUN 16 03/10/2016 1535   CREATININE 0.91 03/10/2016 1535      Component Value Date/Time   CALCIUM 9.8 03/10/2016 1535   ALKPHOS 73 03/10/2016 1535   AST 16 03/10/2016 1535   ALT 19 03/10/2016 1535   BILITOT 1.1 03/10/2016 1535        No results found for this or any previous visit (from the past 72 hour(s)). No results found.    Assessment and Plan: 26 y.o. male with groin pain: Etiology is somewhat unclear. Doubtful for kidney stones or inguinal hernia as this is been evaluated previously.     I suspect Phillip Torres may have a sports hernia. Plan for referral to physical therapy for core strengthening. We'll recheck in 6 weeks if not better at that time will proceed likely with MRI.     Orders Placed This Encounter  Procedures  . DG Pelvis 1-2 Views  Standing Status:   Future    Number of Occurrences:   1    Standing Expiration Date:   01/22/2018    Order Specific Question:   Reason for Exam (SYMPTOM  OR DIAGNOSIS REQUIRED)    Answer:   eval right hip pain    Order Specific Question:   Preferred imaging location?    Answer:   [facility]    Order Specific Question:   Radiology Contrast Protocol - do NOT remove file path    Answer:   [email or web address]  . Ambulatory referral to Physical Therapy    Referral Priority:    Routine    Referral Type:   Physical Medicine    Referral Reason:   Specialty Services Required    Requested Specialty:   Physical Therapy   No orders of the defined types were placed in this encounter.    Discussed warning signs or symptoms. Please see discharge instructions. Patient expresses understanding.  CC: Doreene Nest, NP

## 2016-12-10 ENCOUNTER — Ambulatory Visit: Payer: 59 | Admitting: Physical Therapy

## 2016-12-10 ENCOUNTER — Encounter: Payer: Self-pay | Admitting: Rehabilitative and Restorative Service Providers"

## 2016-12-10 ENCOUNTER — Ambulatory Visit (INDEPENDENT_AMBULATORY_CARE_PROVIDER_SITE_OTHER): Payer: 59 | Admitting: Rehabilitative and Restorative Service Providers"

## 2016-12-10 DIAGNOSIS — R29898 Other symptoms and signs involving the musculoskeletal system: Secondary | ICD-10-CM | POA: Diagnosis not present

## 2016-12-10 DIAGNOSIS — G8929 Other chronic pain: Secondary | ICD-10-CM | POA: Diagnosis not present

## 2016-12-10 DIAGNOSIS — R1031 Right lower quadrant pain: Secondary | ICD-10-CM | POA: Diagnosis not present

## 2016-12-10 NOTE — Therapy (Addendum)
[facility] [place], [place], [address] Phone: [phone]   Fax:  [phone]  Physical Therapy Evaluation  Patient Details  Name: Phillip Torres MRN: [id number] Date of Birth: [birth year] Referring Provider: Dr Phillip Torres   Encounter Date: 12/10/2016      PT End of Session - 12/10/16 1532    Visit Number 1   Number of Visits 12   Date for PT Re-Evaluation 01/21/17   PT Start Time 1532   PT Stop Time 1628   PT Time Calculation (min) 56 min   Activity Tolerance Patient tolerated treatment well      History reviewed. No pertinent past medical history.  Past Surgical History:  Procedure Laterality Date  . APPENDECTOMY  2013    There were no vitals filed for this visit.       Subjective Assessment - 12/10/16 1535    Subjective Patient reports that he has pain in the Rt abdomen into the Rt groin and Rt tesicle which has been present for the past year. Symptoms are intermittent in nature and sometimes severe.    Pertinent History appentectomy ~ 7 years ago; chest pain likely related to anxiety worked up summer 2017 with continued intemrittent symptoms of chest tightness and pain    Diagnostic tests xrays (-)    Patient Stated Goals get rid of pain    Currently in Pain? No/denies   Pain Score 0-No pain   Pain Location Groin   Pain Orientation Right   Pain Descriptors / Indicators Sharp  seering    Pain Type Chronic pain   Pain Radiating Towards abdomen to groin    Pain Onset More than a month ago   Pain Frequency Intermittent   Aggravating Factors  maybe lifting; driving; sexual activity; lying on side    Pain Relieving Factors stretching; lying on back            [facility] PT Assessment - 12/10/16 0001      Assessment   Medical Diagnosis Rt groin pain    Referring Provider Dr Phillip Torres    Onset Date/Surgical Date 12/07/15   Hand Dominance Right   Next MD Visit 11/18   Prior Therapy none      Precautions   Precautions None     Balance Screen   Has the patient fallen in the past 6 months No   Has the patient had a decrease in activity level because of a fear of falling?  No   Is the patient reluctant to leave their home because of a fear of falling?  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 Standing      AROM   Lumbar Flexion 80%   Lumbar Extension 65%   Lumbar - Right Side Bend 95%   Lumbar - Left Side Bend 95%   Lumbar - Right Rotation 65%   Lumbar - Left Rotation 65%     Strength   Overall Strength Comments 5/5 bilat LE's  Flexibility   Hamstrings tight ~ 65-70 deg    Quadriceps mild tightness bilat    ITB WFL's    Piriformis WFL's      Palpation   Spinal mobility WFL's lumbar spine   Palpation comment banding and tightness Rt rectus abdominus and obliques lower ribs to pelvis anteriorly             Objective measurements completed on examination: See above findings.          [place] Adult PT Treatment/Exercise - 12/10/16 0001      Self-Care   Self-Care --  erogonimic suggestins for work      Therapeutic Activites    Therapeutic Activities --  myofacial ball release work      Neuro Re-ed    Neuro Re-ed Details  postural correction      Lumbar Exercises: Stretches   Prone on Elbows Stretch --  1-2 min supported on pillow    Press Ups --  2-3 sec sag at end of range x 10 reps      Lumbar Exercises: Supine   Other Supine Lumbar Exercises prolonged snow angel stretch on noodle x 2 min      Shoulder  Exercises: Stretch   Other Shoulder Stretches 3 way doorway pec stretch 30 sec x 2 reps      Manual Therapy   Manual therapy comments supine    Soft tissue mobilization deep tissue work Rt abdominal musculature in area of banding                 PT Education - 12/10/16 1626    Education provided Yes   Education Details HEP; Geophysicist/field seismologist) Educated Patient   Methods Explanation;Demonstration;Tactile cues;Verbal cues;Handout   Comprehension Verbalized understanding;Returned demonstration;Verbal cues required;Tactile cues required             PT Long Term Goals - 12/10/16 1647      PT LONG TERM GOAL #1   Title Decrease frequency of Rt abdominal and groin pain by 75-100% 01/21/17   Time 6   Period Weeks   Status New     PT LONG TERM GOAL #2   Title Decrease palpable banding and tightness through Rt abdominal musculature 01/21/17   Time 6   Period Weeks   Status New     PT LONG TERM GOAL #3   Title Patient independent in HEP including upper and lower core stabilization exercise program 01/21/17   Time 6   Period Weeks   Status New     PT LONG TERM GOAL #4   Title maintain to decrease FOTO /= 14% limitation 01/21/17   Time 6   Period Weeks   Status New                Plan - 12/10/16 1642    Clinical Impression Statement Phillip Torres presents with one year history of Rt abdominal pain with pain radiating into the Rt groin and testicle. Symptoms are intermittent in nature and it is difficult for patient to determine activities that create pain. He has poor posture and alignment; muscular tightness through the Rt abdominal musculature; poor core strength; pain on an intermittent basis. He will benefit form PT to address problems identified.    Clinical Presentation Stable   Clinical Decision Making Low   Rehab Potential Good   PT Frequency 1x / week   PT Duration 6 weeks   PT Treatment/Interventions Patient/family education;ADLs/Self Care Home  Management;Cryotherapy;Electrical Stimulation;Iontophoresis  9m/ml Dexamethasone;Moist Heat;Ultrasound;Dry needling;Manual techniques;Therapeutic activities;Therapeutic exercise;Neuromuscular re-education   PT Next Visit Plan review HEP; add scapular strengthening/posterior shoudler girdle; core stabilization; manual work v DN Rt avdominals; modalities as indicated; further assessment as indicated    Consulted and Agree with Plan of Care Patient      Patient will benefit from skilled therapeutic intervention in order to improve the following deficits and impairments:  Postural dysfunction, Improper body mechanics, Increased fascial restricitons, Increased muscle spasms, Decreased mobility  Visit Diagnosis: Groin pain, chronic, right - Plan: PT plan of care cert/re-cert  Other symptoms and signs involving the musculoskeletal system - Plan: PT plan of care cert/re-cert     Problem List Patient Active Problem List   Diagnosis Date Noted  . Psoriasis 03/10/2016  . Abdominal pain 11/26/2009    Zalia Hautala PNilda SimmerPT, MPH  12/10/2016, 4:52 PM  [facility] [address] [address]Phone: [phone]  Fax:  [phone] Name: Phillip BEFORTMRN: [id number] of Birth: 1March 29, 1992 PHYSICAL THERAPY DISCHARGE SUMMARY  Visits from Start of Care: evaluation only   Current functional level related to goals / functional outcomes: See progress note    Remaining deficits: Unknown    Education / Equipment: HEP  Plan: Patient agrees to discharge.  Patient goals were not met. Patient is being discharged due to not returning since the last visit.  ?????     Luccia Reinheimer P. HHelene KelpPT, MPH 01/21/17 10:12 AM

## 2016-12-10 NOTE — Patient Instructions (Addendum)
Elbow Prop (Extension)    Prop body up on elbows for ___60-120_ seconds. Slowly lower it. Pillow under chest Using ~3 inch plastic ball to release muscle tightness     Trunk: Prone Extension (Press-Ups)    Lie on stomach on firm, flat surface. Relax bottom and legs. Raise chest in air with elbows straight. Keep hips flat on surface, sag stomach and exhale. Hold __2-3 __ seconds. Repeat __10__ times. Do __2-3 __ sessions per day. CAUTION: Movement should be gentle and slow.    SUPINE Tips A    Being in the supine position means to be lying on the back. Lying on the back is the position of least compression on the bones and discs of the spine, and helps to re-align the natural curves of the back. Arms out to the side at 80-90 deg working toward 120 degrees  2-5 min lying on swim noodle    Lead with hips in all doorway stretches   Scapula Adduction With Pectoralis Stretch: Low - Standing   Shoulders at 45 hands even with shoulders, keeping weight through legs, shift weight forward until you feel pull or stretch through the front of your chest. Hold _30__ seconds. Do _3__ times, _2-4__ times per day.   Scapula Adduction With Pectoralis Stretch: Mid-Range - Standing   Shoulders at 90 elbows even with shoulders, keeping weight through legs, shift weight forward until you feel pull or strength through the front of your chest. Hold __30_ seconds. Do _3__ times, __2-4_ times per day.   Scapula Adduction With Pectoralis Stretch: High - Standing   Shoulders at 120 hands up high on the doorway, keeping weight on feet, shift weight forward until you feel pull or stretch through the front of your chest. Hold _30__ seconds. Do _3__ times, _2-3__ times per day.   APTP.org  Ergonomics  (mouse forward keyboard tray)  (Vu[employer]RYTE - document holder)     Sleeping on Back  Place pillow under knees. A pillow with cervical support and a roll around waist are also  helpful. Copyright  VHI. All rights reserved.  Sleeping on Side Place pillow between knees. Use cervical support under neck and a roll around waist as needed. Copyright  VHI. All rights reserved.   Sleeping on Stomach   If this is the only desirable sleeping position, place pillow under lower legs, and under stomach or chest as needed.  Posture - Sitting   Sit upright, head facing forward. Try using a roll to support lower back. Keep shoulders relaxed, and avoid rounded back. Keep hips level with knees. Avoid crossing legs for long periods. Stand to Sit / Sit to Stand   To sit: Bend knees to lower self onto front edge of chair, then scoot back on seat. To stand: Reverse sequence by placing one foot forward, and scoot to front of seat. Use rocking motion to stand up.   Work Height and Reach  Ideal work height is no more than 2 to 4 inches below elbow level when standing, and at elbow level when sitting. Reaching should be limited to arm's length, with elbows slightly bent.  Bending  Bend at hips and knees, not back. Keep feet shoulder-width apart.    Posture - Standing   Good posture is important. Avoid slouching and forward head thrust. Maintain curve in low back and align ears over shoul- ders, hips over ankles.  Alternating Positions   Alternate tasks and change positions frequently to reduce fatigue and muscle tension. Take rest  breaks. Computer Work   Position work to Art gallery manager. Use proper work and seat height. Keep shoulders back and down, wrists straight, and elbows at right angles. Use chair that provides full back support. Add footrest and lumbar roll as needed.  Getting Into / Out of Car  Lower self onto seat, scoot back, then bring in one leg at a time. Reverse sequence to get out.  Dressing  Lie on back to pull socks or slacks over feet, or sit and bend leg while keeping back straight.    Housework - Sink  Place one foot on ledge of cabinet under  sink when standing at sink for prolonged periods.   Pushing / Pulling  Pushing is preferable to pulling. Keep back in proper alignment, and use leg muscles to do the work.  Deep Squat   Squat and lift with both arms held against upper trunk. Tighten stomach muscles without holding breath. Use smooth movements to avoid jerking.  Avoid Twisting   Avoid twisting or bending back. Pivot around using foot movements, and bend at knees if needed when reaching for articles.  Carrying Luggage   Distribute weight evenly on both sides. Use a cart whenever possible. Do not twist trunk. Move body as a unit.   Lifting Principles .Maintain proper posture and head alignment. .Slide object as close as possible before lifting. .Move obstacles out of the way. .Test before lifting; ask for help if too heavy. .Tighten stomach muscles without holding breath. .Use smooth movements; do not jerk. .Use legs to do the work, and pivot with feet. .Distribute the work load symmetrically and close to the center of trunk. .Push instead of pull whenever possible.   Ask For Help   Ask for help and delegate to others when possible. Coordinate your movements when lifting together, and maintain the low back curve.  Log Roll   Lying on back, bend left knee and place left arm across chest. Roll all in one movement to the right. Reverse to roll to the left. Always move as one unit. Housework - Sweeping  Use long-handled equipment to avoid stooping.   Housework - Wiping  Position yourself as close as possible to reach work surface. Avoid straining your back.  Laundry - Unloading Wash   To unload small items at bottom of washer, lift leg opposite to arm being used to reach.  Gardening - Raking  Move close to area to be raked. Use arm movements to do the work. Keep back straight and avoid twisting.     Cart  When reaching into cart with one arm, lift opposite leg to keep back straight.    Getting Into / Out of Bed  Lower self to lie down on one side by raising legs and lowering head at the same time. Use arms to assist moving without twisting. Bend both knees to roll onto back if desired. To sit up, start from lying on side, and use same move-ments in reverse. Housework - Vacuuming  Hold the vacuum with arm held at side. Step back and forth to move it, keeping head up. Avoid twisting.   Laundry - Armed forces training and education officer so that bending and twisting can be avoided.   Laundry - Unloading Dryer  Squat down to reach into clothes dryer or use a reacher.  Gardening - Weeding / Psychiatric nurse or Kneel. Knee pads may be helpful.

## 2016-12-24 ENCOUNTER — Ambulatory Visit: Payer: Self-pay | Admitting: Family Medicine

## 2017-08-11 ENCOUNTER — Encounter: Payer: Self-pay | Admitting: Family Medicine

## 2017-08-11 ENCOUNTER — Ambulatory Visit (INDEPENDENT_AMBULATORY_CARE_PROVIDER_SITE_OTHER): Payer: 59 | Admitting: Family Medicine

## 2017-08-11 VITALS — BP 121/84 | HR 82 | Ht 72.0 in | Wt 230.0 lb

## 2017-08-11 DIAGNOSIS — R1030 Lower abdominal pain, unspecified: Secondary | ICD-10-CM | POA: Diagnosis not present

## 2017-08-11 MED ORDER — DICYCLOMINE HCL 10 MG PO CAPS: 10.0000 mg | ORAL_CAPSULE | Freq: Three times a day (TID) | ORAL | 3 refills | Status: DC

## 2017-08-11 NOTE — Patient Instructions (Addendum)
Thank you for coming in today. I am worried about IBS.  Lets try dicyclomine with meals.  It should help cramping and pain.  Continue core stability work at home.  Consider Piliates.  If not better recheck in about 6 weeks. Next step is CT scan.  I can order it before your follow up and you can get it and we can go over the results during the follow up.   Irritable Bowel Syndrome, Adult Irritable bowel syndrome (IBS) is not one specific disease. It is a group of symptoms that affects the organs responsible for digestion (gastrointestinal or GI tract). To regulate how your GI tract works, your body sends signals back and forth between your intestines and your brain. If you have IBS, there may be a problem with these signals. As a result, your GI tract does not function normally. Your intestines may become more sensitive and overreact to certain things. This is especially true when you eat certain foods or when you are under stress. There are four types of IBS. These may be determined based on the consistency of your stool:  IBS with diarrhea.  IBS with constipation.  Mixed IBS.  Unsubtyped IBS.  It is important to know which type of IBS you have. Some treatments are more likely to be helpful for certain types of IBS. What are the causes? The exact cause of IBS is not known. What increases the risk? You may have a higher risk of IBS if:  You are a woman.  You are younger than 28 years old.  You have a family history of IBS.  You have mental health problems.  You have had bacterial infection of your GI tract.  What are the signs or symptoms? Symptoms of IBS vary from person to person. The main symptom is abdominal pain or discomfort. Additional symptoms usually include one or more of the following:  Diarrhea, constipation, or both.  Abdominal swelling or bloating.  Feeling full or sick after eating a small or regular-size meal.  Frequent gas.  Mucus in the stool.  A  feeling of having more stool left after a bowel movement.  Symptoms tend to come and go. They may be associated with stress, psychiatric conditions, or nothing at all. How is this diagnosed? There is no specific test to diagnose IBS. Your health care provider will make a diagnosis based on a physical exam, medical history, and your symptoms. You may have other tests to rule out other conditions that may be causing your symptoms. These may include:  Blood tests.  X-rays.  CT scan.  Endoscopy and colonoscopy. This is a test in which your GI tract is viewed with a long, thin, flexible tube.  How is this treated? There is no cure for IBS, but treatment can help relieve symptoms. IBS treatment often includes:  Changes to your diet, such as: ? Eating more fiber. ? Avoiding foods that cause symptoms. ? Drinking more water. ? Eating regular, medium-sized portioned meals.  Medicines. These may include: ? Fiber supplements if you have constipation. ? Medicine to control diarrhea (antidiarrheal medicines). ? Medicine to help control muscle spasms in your GI tract (antispasmodic medicines). ? Medicines to help with any mental health issues, such as antidepressants or tranquilizers.  Therapy. ? Talk therapy may help with anxiety, depression, or other mental health issues that can make IBS symptoms worse.  Stress reduction. ? Managing your stress can help keep symptoms under control.  Follow these instructions at home:  Take  medicines only as directed by your health care provider.  Eat a healthy diet. ? Avoid foods and drinks with added sugar. ? Include more whole grains, fruits, and vegetables gradually into your diet. This may be especially helpful if you have IBS with constipation. ? Avoid any foods and drinks that make your symptoms worse. These may include dairy products and caffeinated or carbonated drinks. ? Do not eat large meals. ? Drink enough fluid to keep your urine clear or  pale yellow.  Exercise regularly. Ask your health care provider for recommendations of good activities for you.  Keep all follow-up visits as directed by your health care provider. This is important. Contact a health care provider if:  You have constant pain.  You have trouble or pain with swallowing.  You have worsening diarrhea. Get help right away if:  You have severe and worsening abdominal pain.  You have diarrhea and: ? You have a rash, stiff neck, or severe headache. ? You are irritable, sleepy, or difficult to awaken. ? You are weak, dizzy, or extremely thirsty.  You have bright red blood in your stool or you have black tarry stools.  You have unusual abdominal swelling that is painful.  You vomit continuously.  You vomit blood (hematemesis).  You have both abdominal pain and a fever. This information is not intended to replace advice given to you by your health care provider. Make sure you discuss any questions you have with your health care provider. Document Released: 02/22/2005 Document Revised: 07/25/2015 Document Reviewed: 11/09/2013 Elsevier Interactive Patient Education  2018 [employer].  Diet for Irritable Bowel Syndrome When you have irritable bowel syndrome (IBS), the foods you eat and your eating habits are very important. IBS may cause various symptoms, such as abdominal pain, constipation, or diarrhea. Choosing the right foods can help ease discomfort caused by these symptoms. Work with your health care provider and dietitian to find the best eating plan to help control your symptoms. What general guidelines do I need to follow?  Keep a food diary. This will help you identify foods that cause symptoms. Write down: ? What you eat and when. ? What symptoms you have. ? When symptoms occur in relation to your meals.  Avoid foods that cause symptoms. Talk with your dietitian about other ways to get the same nutrients that are in these foods.  Eat more  foods that contain fiber. Take a fiber supplement if directed by your dietitian.  Eat your meals slowly, in a relaxed setting.  Aim to eat 5-6 small meals per day. Do not skip meals.  Drink enough fluids to keep your urine clear or pale yellow.  Ask your health care provider if you should take an over-the-counter probiotic during flare-ups to help restore healthy gut bacteria.  If you have cramping or diarrhea, try making your meals low in fat and high in carbohydrates. Examples of carbohydrates are pasta, rice, whole grain breads and cereals, fruits, and vegetables.  If dairy products cause your symptoms to flare up, try eating less of them. You might be able to handle yogurt better than other dairy products because it contains bacteria that help with digestion. What foods are not recommended? The following are some foods and drinks that may worsen your symptoms:  Fatty foods, such as [place] fries.  Milk products, such as cheese or ice cream.  Chocolate.  Alcohol.  Products with caffeine, such as coffee.  Carbonated drinks, such as soda.  The items listed above  may not be a complete list of foods and beverages to avoid. Contact your dietitian for more information. What foods are good sources of fiber? Your health care provider or dietitian may recommend that you eat more foods that contain fiber. Fiber can help reduce constipation and other IBS symptoms. Add foods with fiber to your diet a little at a time so that your body can get used to them. Too much fiber at once might cause gas and swelling of your abdomen. The following are some foods that are good sources of fiber:  Apples.  Peaches.  Pears.  Berries.  Figs.  Broccoli (raw).  Cabbage.  Carrots.  Raw peas.  Kidney beans.  Lima beans.  Whole grain bread.  Whole grain cereal.  Where to find more information: [employer] for Functional Gastrointestinal Disorders: www.iffgd.[employer] of Diabetes and Digestive and Kidney Diseases: http://norris-lawson.com/.aspx This information is not intended to replace advice given to you by your health care provider. Make sure you discuss any questions you have with your health care provider. Document Released: 05/15/2003 Document Revised: 07/31/2015 Document Reviewed: 05/25/2013 Elsevier Interactive Patient Education  2018 [employer].

## 2017-08-11 NOTE — Progress Notes (Signed)
Phillip Torres is a 27 y.o. male who presents to [facility]: Primary Care Sports Medicine today for follow-up right groin pain.  Phillip Torres was seen in September 2018 for right groin pain.  At that time the etiology was somewhat unclear he was thought to perhaps have a sports hernia.  He was referred to physical therapy and had one session but had difficulty affording it and did not have any further.  His symptoms have occurred off and on over the past 8 months or so.  He does note however the majority of the pain and discomfort is now more superior into his abdomen.  He points to an area to the side of his umbilicus as the area of maximal discomfort.  He describes cramping and discomfort especially before having a bowel movement.  He will note that when he expresses cramping he will typically have a diarrheal bowel movement.  He does this occurs a few times a week and is uncomfortable.  Otherwise he denies any constipation vomiting fevers or chills.  He denies any blood in the diarrhea.  He notes his symptoms sometimes change when he eats poorly.  He has a pertinent past surgical history for appendectomy in 2011.  He notes that he does still experience some symptoms into his right groin he describes as a burning sensation.  He notes this is mild at this point and not particularly bothersome.   ROS as above:  Exam:  BP 121/84   Pulse 82   Ht 6' (1.829 m)   Wt 230 lb (104.3 kg)   BMI 31.19 kg/m  Gen: Well NAD HEENT: EOMI,  MMM Lungs: Normal work of breathing. CTABL Heart: RRR no MRG Abd: NABS, Soft. Nondistended, minimally to mildly tender right lower quadrant.  No rebound or guarding or masses palpated.  Symptoms are improved with abdominal wall flexion and worse with abdominal wall relaxation. Exts: Brisk capillary refill, warm and well perfused.   Lab and Radiology Results CT ABDOMEN AND PELVIS WITH  CONTRAST   Technique:  Multidetector CT imaging of the abdomen and pelvis was performed following the standard protocol during bolus administration of intravenous contrast.   Contrast: 100 ml intravenous Omnipaque-300   Comparison: None   Findings: The liver, spleen, gallbladder, adrenal glands, pancreas and kidneys are unremarkable. No free fluid, enlarged lymph nodes, biliary dilation or abdominal aortic aneurysm identified.   A thickened appendix with mild adjacent inflammation is compatible with appendicitis. There is no evidence of abscess, bowel obstruction, or pneumoperitoneum.   The bowel is otherwise unremarkable. The bladder is within normal limits. No acute or suspicious bony abnormalities are identified.   IMPRESSION: Appendicitis without complicating features.   These results were called to Dr. Terressa Koyanagi on 02/18/2010 at 11 AM.  Provider: Joline Maxcy  EXAM: LIMITED ULTRASOUND OF PELVIS  TECHNIQUE: Longitudinal and transverse images were obtained of the right pelvis/inguinal region. Left inguinal region was interrogated for comparison.  COMPARISON:  None.  FINDINGS: No inguinal hernias seen. No mass or inflammatory focus. No abnormal fluid.  IMPRESSION: No lesion identified. In particular, no hernias apparent by ultrasound.  I personally (independently) visualized and performed the interpretation of the images attached in this note.   Assessment and Plan: 27 y.o. male with  Right lower quadrant abdominal pain:  Symptoms are bit different on today's exam than they were in September.  I think it is much less likely that he has an orthopedic or sports medicine  related issue.  I think his symptoms are much more likely to be visceral in nature.  The differential includes IBS, adhesions, or some other abdominal etiology.  Fortunately today his exam is relatively benign.  There is he has waxing and waning episodes of diarrhea and cramping I  think IBS is quite likely.  After discussion plan to empirically treat with a trial of dicyclomine.  Recheck in a month or so and if improved I think is reasonable to continue this medication indefinitely with PCP.  If not better next step I think would be reasonable to refer to gastroenterology or obtain a CT scan.    As his symptoms are less likely to be an orthopedic or sports medicine related issue plan to transfer care back to PCP in the near future.     No orders of the defined types were placed in this encounter.  Meds ordered this encounter  Medications  . dicyclomine (BENTYL) 10 MG capsule    Sig: Take 1 capsule (10 mg total) by mouth 4 (four) times daily -  before meals and at bedtime.    Dispense:  120 capsule    Refill:  3     Historical information moved to improve visibility of documentation.  No past medical history on file. Past Surgical History:  Procedure Laterality Date  . APPENDECTOMY  2013   Social History   Tobacco Use  . Smoking status: Never Smoker  . Smokeless tobacco: Never Used  Substance Use Topics  . Alcohol use: No    Alcohol/week: 0.0 oz   family history includes Lupus in his mother.  Medications: Current Outpatient Medications  Medication Sig Dispense Refill  . dicyclomine (BENTYL) 10 MG capsule Take 1 capsule (10 mg total) by mouth 4 (four) times daily -  before meals and at bedtime. 120 capsule 3   No current facility-administered medications for this visit.    Allergies  Allergen Reactions  . Loratadine     REACTION: headache    Health Maintenance Health Maintenance  Topic Date Due  . HIV Screening  01/28/2005  . TETANUS/TDAP  01/28/2009  . INFLUENZA VACCINE  10/06/2017    Discussed warning signs or symptoms. Please see discharge instructions. Patient expresses understanding.

## 2017-09-19 ENCOUNTER — Encounter: Payer: Self-pay | Admitting: Family Medicine

## 2017-09-19 DIAGNOSIS — R1032 Left lower quadrant pain: Secondary | ICD-10-CM

## 2017-09-23 ENCOUNTER — Ambulatory Visit (INDEPENDENT_AMBULATORY_CARE_PROVIDER_SITE_OTHER): Payer: 59

## 2017-09-23 DIAGNOSIS — R1032 Left lower quadrant pain: Secondary | ICD-10-CM

## 2017-09-23 DIAGNOSIS — K429 Umbilical hernia without obstruction or gangrene: Secondary | ICD-10-CM | POA: Diagnosis not present

## 2017-09-23 MED ORDER — IOPAMIDOL (ISOVUE-300) INJECTION 61%
100.0000 mL | Freq: Once | INTRAVENOUS | Status: AC | PRN
  Administered 2017-09-23: 100 mL via INTRAVENOUS

## 2018-07-18 IMAGING — US US PELVIS LIMITED
1 series · 14 of 20 positions shown · non-contrast
Comparison: None.

CLINICAL DATA: Right inguinal region pain, intermittent

EXAM:
LIMITED ULTRASOUND OF PELVIS
TECHNIQUE: Longitudinal and transverse images were obtained of the right
pelvis/inguinal region. Left inguinal region was interrogated for
comparison.

[Series 1: us pelvis limited · 0.10mm/px · 14 of 20 slices shown]
[im 1/20]
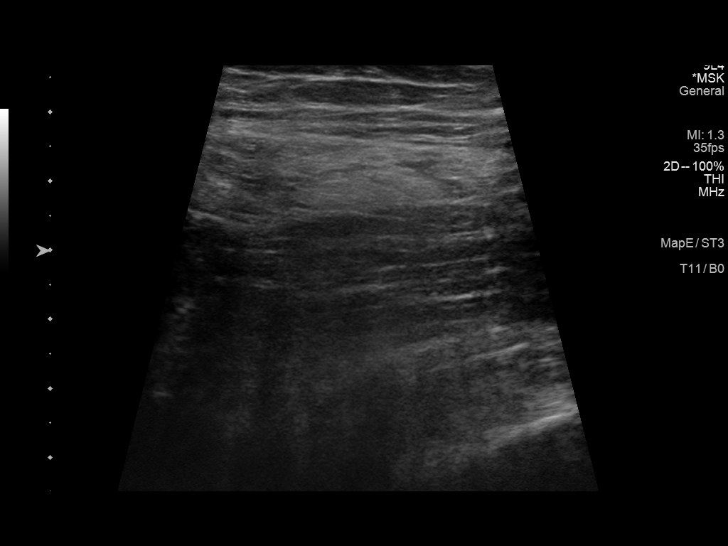
[im 3/20]
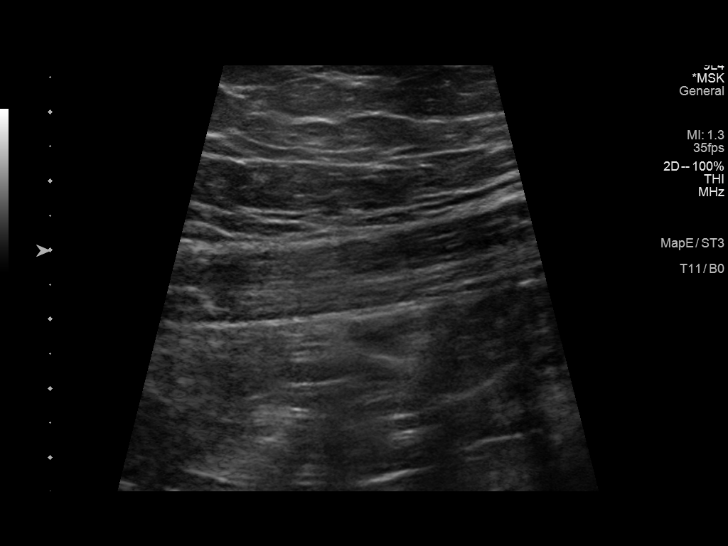
[im 4/20]
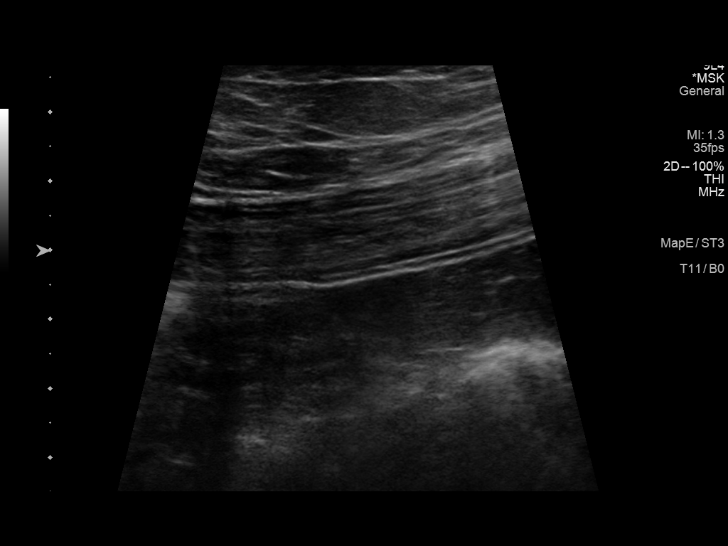
[im 6/20]
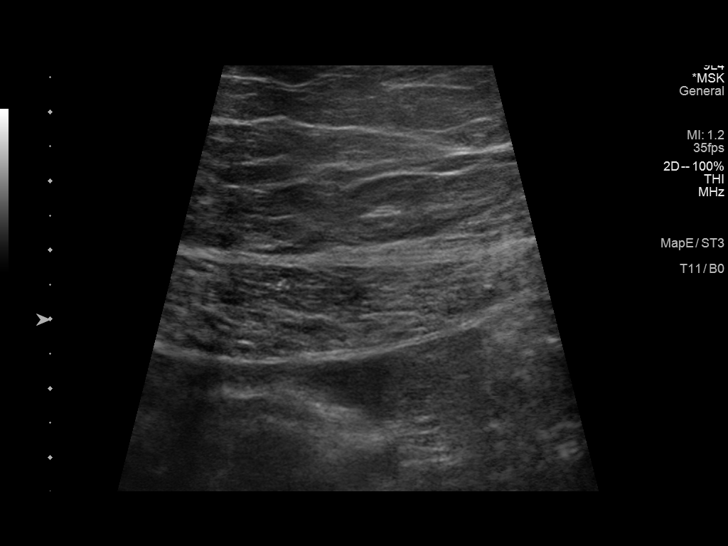
[im 7/20]
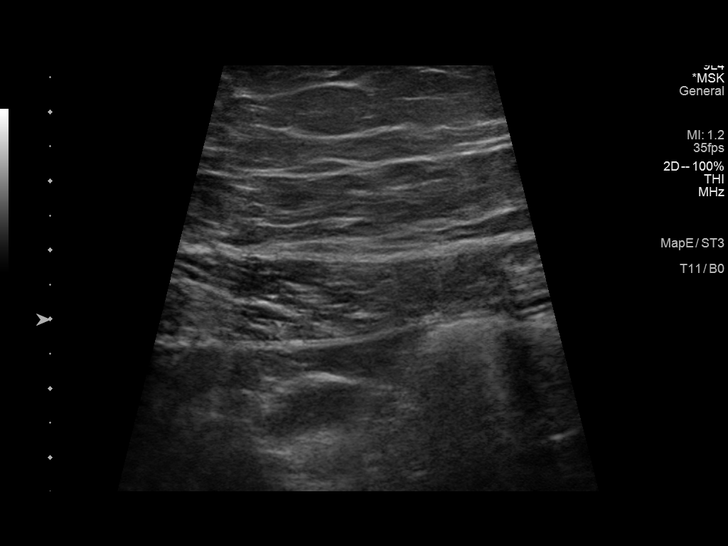
[im 8/20]
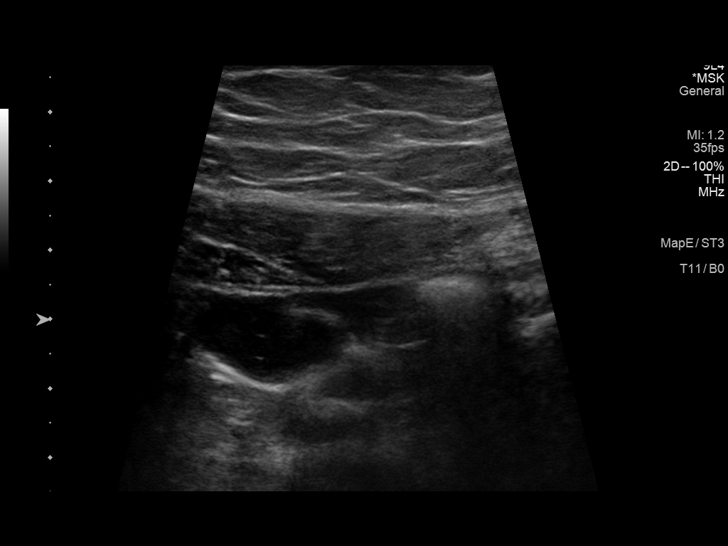
[im 10/20]
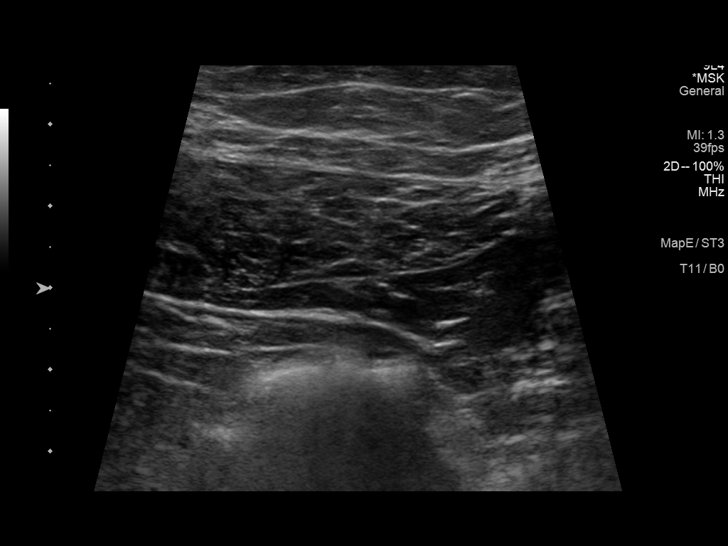
[im 11/20]
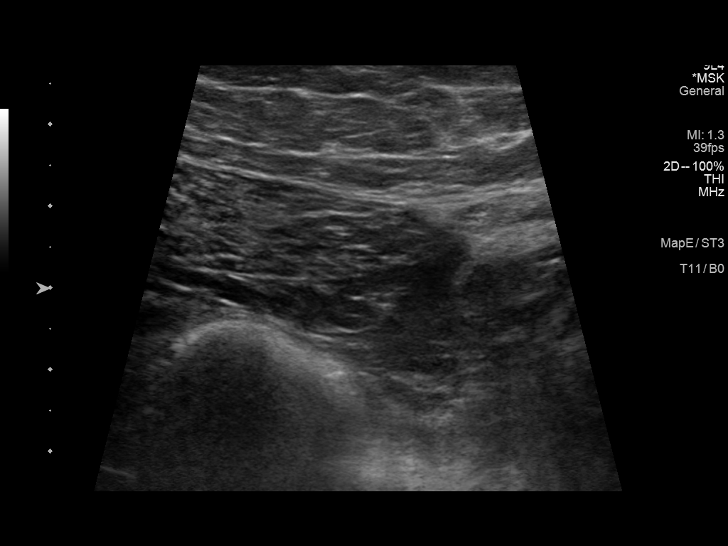
[im 13/20]
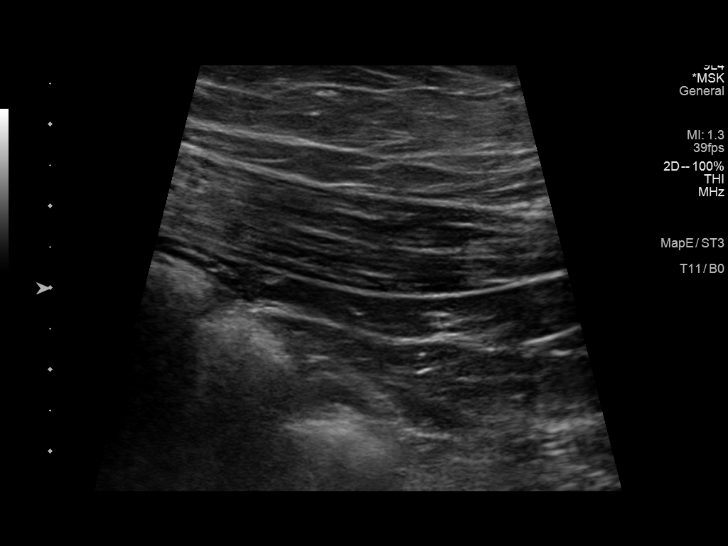
[im 14/20]
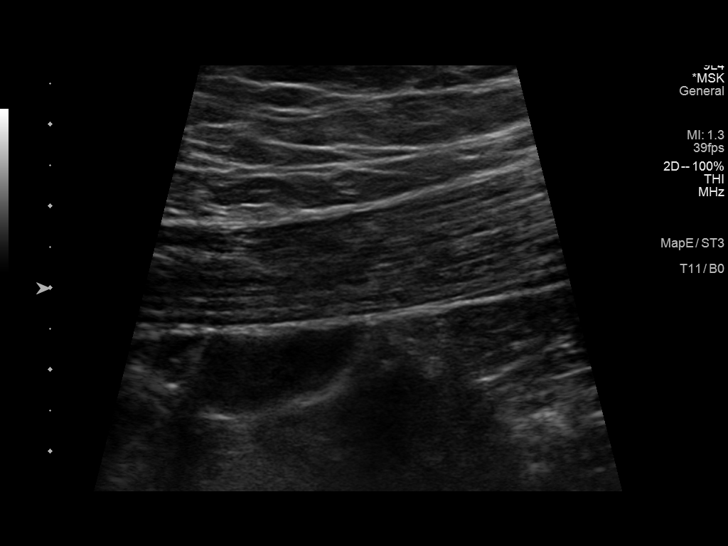
[im 16/20]
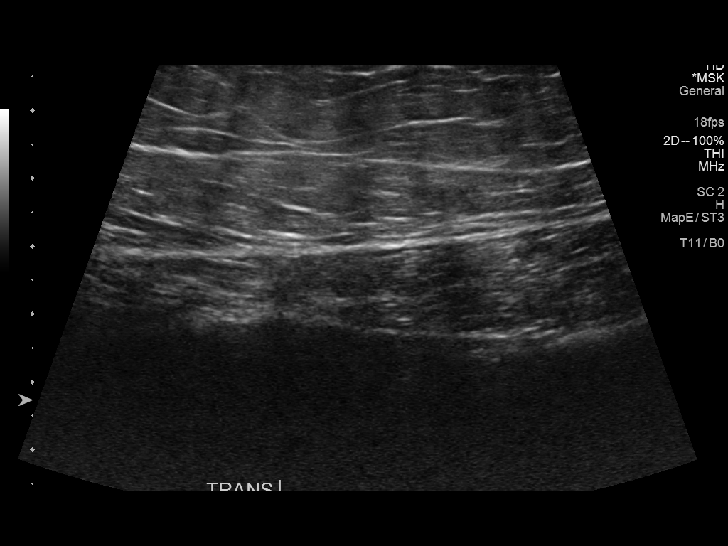
[im 17/20]
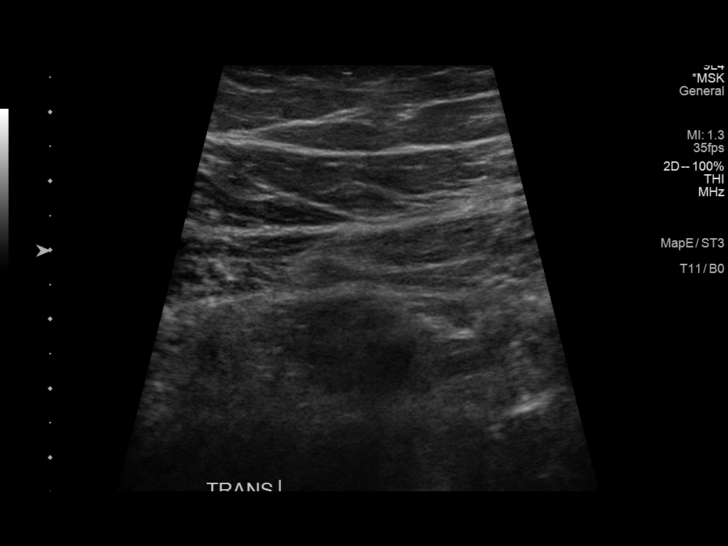
[im 18/20]
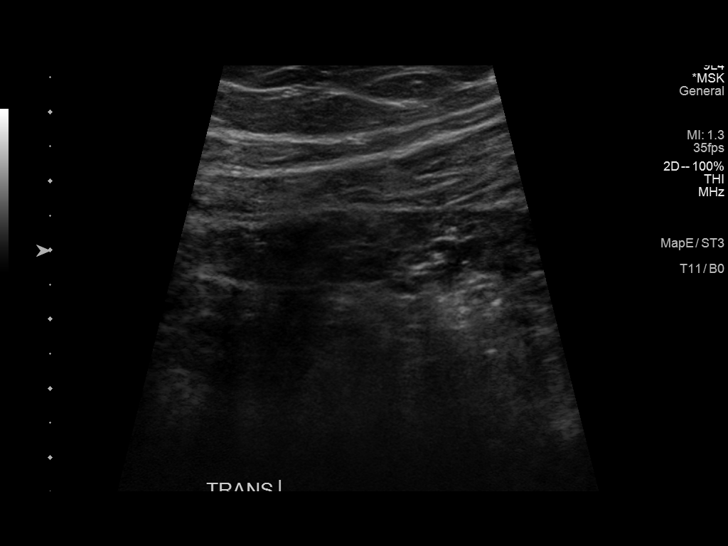
[im 20/20]
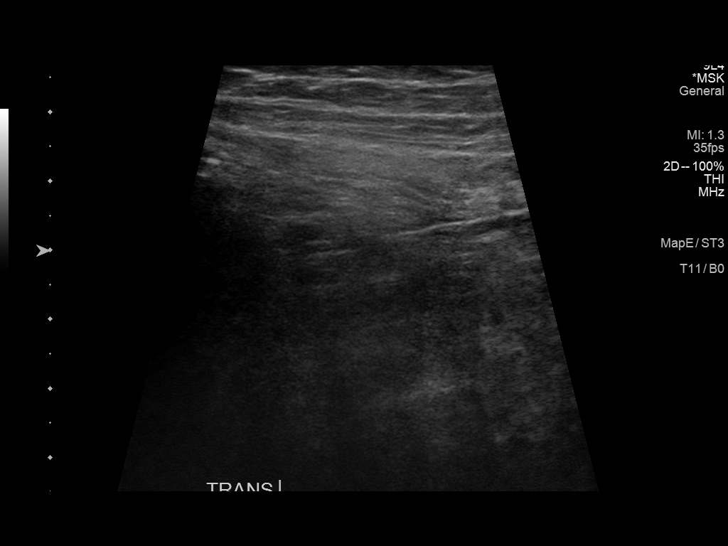

[14 of 20 positions shown; findings below may reference images not displayed]

FINDINGS: No inguinal hernias seen. No mass or inflammatory focus. No abnormal
fluid.
IMPRESSION: No lesion identified. In particular, no hernias apparent by
ultrasound.

## 2019-01-25 ENCOUNTER — Ambulatory Visit (INDEPENDENT_AMBULATORY_CARE_PROVIDER_SITE_OTHER): Payer: 59

## 2019-01-25 ENCOUNTER — Other Ambulatory Visit: Payer: Self-pay

## 2019-01-25 ENCOUNTER — Encounter: Payer: Self-pay | Admitting: Sports Medicine

## 2019-01-25 ENCOUNTER — Ambulatory Visit (INDEPENDENT_AMBULATORY_CARE_PROVIDER_SITE_OTHER): Payer: 59 | Admitting: Sports Medicine

## 2019-01-25 VITALS — BP 123/78 | HR 78 | Ht 72.0 in | Wt 249.0 lb

## 2019-01-25 DIAGNOSIS — I861 Scrotal varices: Secondary | ICD-10-CM

## 2019-01-25 DIAGNOSIS — R109 Unspecified abdominal pain: Secondary | ICD-10-CM | POA: Diagnosis not present

## 2019-01-25 LAB — POCT URINALYSIS DIP (CLINITEK)
Bilirubin, UA: NEGATIVE
Blood, UA: NEGATIVE
Glucose, UA: NEGATIVE mg/dL
Ketones, POC UA: NEGATIVE mg/dL
Leukocytes, UA: NEGATIVE
Nitrite, UA: NEGATIVE
POC PROTEIN,UA: NEGATIVE
Spec Grav, UA: >=1.03 — AB (ref 1.010–1.025)
Urobilinogen, UA: 0.2 E.U./dL
pH, UA: 5.5 (ref 5.0–8.0)

## 2019-01-25 NOTE — Progress Notes (Signed)
Subjective:    CC: Lower abdominal and groin pain  HPI: For years this pleasant 28 year old male has had a vague pain in his right lower abdomen, with radiation into the pelvis and into the right hemiscrotum.  He has had somewhat of work-up, negative urinalysis, negative CT of the abdomen and pelvis, no associated constitutional symptoms, nausea, vomiting, diarrhea.  Symptoms seem to be worse when he is sitting for long periods of time.  Worse with straining.  I reviewed the past medical history, family history, social history, surgical history, and allergies today and no changes were needed.  Please see the problem list section below in epic for further details.  Past Medical History: No past medical history on file. Past Surgical History: Past Surgical History:  Procedure Laterality Date  . APPENDECTOMY  2013   Social History: Social History   Socioeconomic History  . Marital status: Single    Spouse name: Not on file  . Number of children: Not on file  . Years of education: Not on file  . Highest education level: Not on file  Occupational History  . Not on file  Social Needs  . Financial resource strain: Not on file  . Food insecurity    Worry: Not on file    Inability: Not on file  . Transportation needs    Medical: Not on file    Non-medical: Not on file  Tobacco Use  . Smoking status: Never Smoker  . Smokeless tobacco: Never Used  Substance and Sexual Activity  . Alcohol use: No    Alcohol/week: 0.0 standard drinks  . Drug use: No  . Sexual activity: Not on file  Lifestyle  . Physical activity    Days per week: Not on file    Minutes per session: Not on file  . Stress: Not on file  Relationships  . Social Musician on phone: Not on file    Gets together: Not on file    Attends religious service: Not on file    Active member of club or organization: Not on file    Attends meetings of clubs or organizations: Not on file    Relationship status:  Not on file  Other Topics Concern  . Not on file  Social History Narrative   Single.   Works as an Systems developer.   Lives in [place].   Enjoys relaxing.    Family History: Family History  Problem Relation Age of Onset  . Lupus Mother    Allergies: Allergies  Allergen Reactions  . Loratadine     REACTION: headache   Medications: See med rec.  Review of Systems: No fevers, chills, night sweats, weight loss, chest pain, or shortness of breath.   Objective:    General: Well Developed, well nourished, and in no acute distress.  Neuro: Alert and oriented x3, extra-ocular muscles intact, sensation grossly intact.  HEENT: Normocephalic, atraumatic, pupils equal round reactive to light, neck supple, no masses, no lymphadenopathy, thyroid nonpalpable.  Skin: Warm and dry, no rashes. Cardiac: Regular rate and rhythm, no murmurs rubs or gallops, no lower extremity edema.  Respiratory: Clear to auscultation bilaterally. Not using accessory muscles, speaking in full sentences. Abdomen: Soft, nontender, nondistended, normal bowel sounds, no palpable masses, no guarding, rigidity, rebound tenderness. Genitourinary: There is a varicocele on the right with minimal tenderness to palpation, testicles are otherwise unremarkable, no palpable hernias on Valsalva.  No penile discharge, no testicular masses.  Impression and Recommendations:  Right varicocele Chronic on and off right lower pelvic and abdominal pain radiating to the scrotum. There is an obvious varicocele on the right. This is likely what been causing his discomfort for several years. Getting official ultrasound to ensure no testicular masses, I have reviewed his abdominal and pelvic CT which was for the most part unremarkable with the exception of significant constipation. I have recommended scrotal support underwear that he can find online, return as needed as this is not a sports medicine problem.    ___________________________________________ Gwen Her. Dianah Field, M.D., ABFM., CAQSM. Primary Care and Sports Medicine [facility]  Adjunct Professor of [employer] [facility] [employer]

## 2019-01-25 NOTE — Assessment & Plan Note (Addendum)
Chronic on and off right lower pelvic and abdominal pain radiating to the scrotum. There is an obvious varicocele on the right. This is likely what been causing his discomfort for several years. Getting official ultrasound to ensure no testicular masses, I have reviewed his abdominal and pelvic CT which was for the most part unremarkable with the exception of significant constipation. I have recommended scrotal support underwear that he can find online, return as needed as this is not a sports medicine problem.

## 2019-01-25 NOTE — Patient Instructions (Signed)
Varicocele  A varicocele is a swelling of veins in the scrotum. The scrotum is the sac that contains the testicles. Varicoceles can occur on either side of the scrotum, but they are more common on the left side. They occur most often in teenage boys and young men. In most cases, varicoceles are not a serious problem. They are usually small and painless and do not require treatment. Tests may be done to confirm the diagnosis. Treatment may be needed if:  A varicocele is large, causes a lot of pain, or causes pain when exercising.  Varicoceles are found on both sides of the scrotum.  A varicocele causes a decrease in the size of the testicle in a growing adolescent.  The person has fertility problems. What are the causes? This condition is the result of valves in the veins not working properly. Valves in the veins help to return blood from the scrotum and testicles to the heart. If these valves do not work well, blood flows backward and backs up into the veins, which causes the veins to swell. This is similar to what happens when varicose veins form in the leg. What are the signs or symptoms? Most varicoceles do not cause any symptoms. If symptoms do occur, they may include:  Swelling on one side of the scrotum. The swelling may be more obvious when you are standing up.  A lumpy feeling in the scrotum.  A heavy feeling on one side of the scrotum.  A dull ache in the scrotum, especially after exercise or prolonged standing or sitting.  Slower growth or reduced size of the testicle on the side of the varicocele (in young males).  Problems with fathering a child (fertility). This can occur if the testicle does not grow normally or if the condition causes problems with the sperm, such as a low sperm count or sperm that are not able to reach the egg (poor motility). How is this diagnosed? This condition is diagnosed based on:  Your medical history.  A physical exam. Your health care  provider may inspect and feel (palpate) the scrotal area to check for swollen or enlarged veins.  An ultrasound. This may be done to confirm the diagnosis and to help rule out other causes of the swelling. How is this treated? Treatment is usually not needed for this condition. If you have any pain, your health care provider may prescribe or recommend medicine to help relieve it. You may need regular exams so your health care provider can monitor the varicocele to ensure that it does not cause problems. When further treatment is needed, it may involve one of these options:  Varicocelectomy. This is a surgery in which the swollen veins are tied off so that the flow of blood goes to other veins instead.  Embolization. In this procedure, a small, thin tube (catheter) is used to place metal coils or other blocking items in the veins. This cuts off the blood flow to the swollen veins. Follow these instructions at home:  Take over-the-counter and prescription medicines only as told by your health care provider.  Wear supportive underwear.  Use an athletic supporter when participating in sports activities.  Keep all follow-up visits as told by your health care provider. This is important. Contact a health care provider if:  Your pain is increasing.  You have redness in the affected area.  Your testicle becomes enlarged, swollen, or painful.  You have swelling that does not decrease when you are lying down.    One of your testicles is smaller than the other. Get help right away if:  You develop swelling in your legs.  You have difficulty breathing. Summary  Varicocele is a condition in which the veins in the scrotum are swollen or enlarged.  In most cases, varicoceles do not require treatment.  Treatment may be needed if you have pain, have problems with infertility, or have a smaller testicle associated with the varicocele.  In some cases, the condition may be treated with a  procedure to cut off the flow of blood to the swollen veins. This information is not intended to replace advice given to you by your health care provider. Make sure you discuss any questions you have with your health care provider. Document Released: 05/31/2000 Document Revised: 05/12/2018 Document Reviewed: 05/12/2018 Elsevier Patient Education  2020 [employer].    Varicocelectomy  Varicocelectomy is a procedure to treat a varicocele. A varicocele is a swelling of veins inside the pouch that holds the testicles (scrotum). You may need this surgery if a varicocele is causing pain, shrinking your testicle, or making it hard for you to father a child (infertility). In this surgery, the swollen veins are tied off so that the flow of blood goes to other veins instead. The surgery may be done in different ways, including:  Open traditional. The repair is done through one large incision.  Laparoscopic. The repair is done with the help of a thin, lighted tube that has a camera (laparoscope). The scope and other tools are inserted through a few small incisions.  Microscopic. A microscope is used to find all of the small veins that need repair. Your surgeon will help you decide which approach is best for your condition. Tell a health care provider about:  Any allergies you have.  All medicines you are taking, including vitamins, herbs, eye drops, creams, and over-the-counter medicines.  Any problems you or family members have had with anesthetic medicines.  Any blood disorders you have.  Any surgeries you have had.  Any medical conditions you have. What are the risks? Generally, this is a safe procedure. However, problems may occur, including:  Bleeding.  Infection.  Allergic reactions to medicines.  Damage to the testicle.  Damage to the blood vessels that supply the testicle.  Fluid buildup in the scrotum (hydrocele). What happens before the procedure? Staying hydrated  Follow instructions from your health care provider about hydration, which may include:  Up to 2 hours before the procedure - you may continue to drink clear liquids, such as water, clear fruit juice, black coffee, and plain tea. Eating and drinking restrictions Follow instructions from your health care provider about eating and drinking, which may include:  8 hours before the procedure - stop eating heavy meals or foods, such as meat, fried foods, or fatty foods.  6 hours before the procedure - stop eating light meals or foods, such as toast or cereal.  6 hours before the procedure - stop drinking milk or drinks that contain milk.  2 hours before the procedure - stop drinking clear liquids. Medicines Ask your health care provider about:  Changing or stopping your regular medicines. This is especially important if you are taking diabetes medicines or blood thinners.  Taking medicines such as aspirin and ibuprofen. These medicines can thin your blood. Do not take these medicines unless your health care provider tells you to take them.  Taking over-the-counter medicines, vitamins, herbs, and supplements. General instructions  Ask your health care provider: ?  How your surgery site will be marked. ? What steps will be taken to help prevent infection. These may include:  Removing hair at the surgery site.  Washing skin with a germ-killing soap.  Taking antibiotic medicine.  Do not use any products that contain nicotine or tobacco for at least 4 weeks before the procedure. These products include cigarettes, e-cigarettes, and chewing tobacco. If you need help quitting, ask your health care provider.  Plan to have someone take you home from the hospital or clinic. What happens during the procedure?  An IV will be inserted into one of your veins.  You will be given one or more of the following: ? A medicine to help you relax (sedative). ? A medicine to numb the area (local  anesthetic). ? A medicine to make you fall asleep (general anesthetic).  One to three incisions will be made. The size, location, and number of incisions will depend on the technique and approach used by the surgeon. Incisions may be made in any of these areas: ? Abdomen (retroperitoneal approach). ? Groin (inguinal approach). ? Below the groin (subinguinal approach).  The veins will be clipped or cut and tied off.  The incisions will be closed with stitches (sutures). Small adhesive strips may also be placed over the incisions.  The incision area will be covered with a light bandage (dressing). The procedure may vary among health care providers and hospitals. What happens after the procedure?  Your blood pressure, heart rate, breathing rate, and blood oxygen level will be monitored until you leave the hospital or clinic.  You will be given pain medicine as needed.  You may have to wear an athletic support strap to hold the dressing in place and to support your scrotum.  Do not drive for 24 hours if you were given a sedative during your procedure. Summary  Varicocelectomy is a procedure to treat a varicocele, which is a swelling of veins inside the pouch that holds your testicles (scrotum).  In this surgery, the swollen veins are tied off so that the flow of blood goes to other veins instead.  Follow instructions from your health care provider about taking medicines and about eating and drinking before the procedure. This information is not intended to replace advice given to you by your health care provider. Make sure you discuss any questions you have with your health care provider. Document Released: 02/03/2015 Document Revised: 05/12/2018 Document Reviewed: 05/12/2018 Elsevier Patient Education  [employer].

## 2019-01-26 ENCOUNTER — Encounter: Payer: Self-pay | Admitting: Sports Medicine

## 2021-01-05 DIAGNOSIS — L400 Psoriasis vulgaris: Secondary | ICD-10-CM | POA: Diagnosis not present

## 2021-04-10 DIAGNOSIS — R21 Rash and other nonspecific skin eruption: Secondary | ICD-10-CM | POA: Diagnosis not present

## 2021-04-10 DIAGNOSIS — F419 Anxiety disorder, unspecified: Secondary | ICD-10-CM | POA: Diagnosis not present

## 2021-04-24 IMAGING — US US SCROTUM
1 series · 13 of 25 positions shown · non-contrast
Comparison: None

CLINICAL DATA: RIGHT scrotal and RIGHT lower quadrant pain for 2
years, RIGHT flank pain, question varicocele

EXAM:
ULTRASOUND OF SCROTUM
TECHNIQUE: Complete ultrasound examination of the testicles, epididymis, and
other scrotal structures was performed.

[Series 1: us scrotum · 0.07mm/px · 13 of 56 slices shown]
[im 1/56]
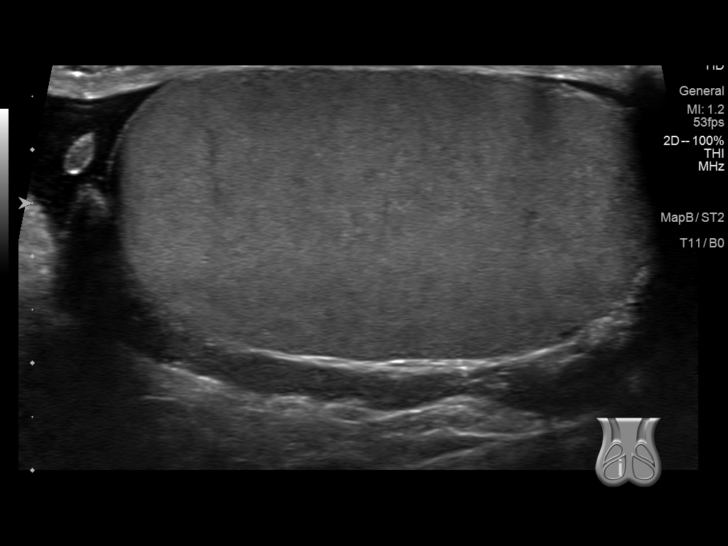
[im 5/56]
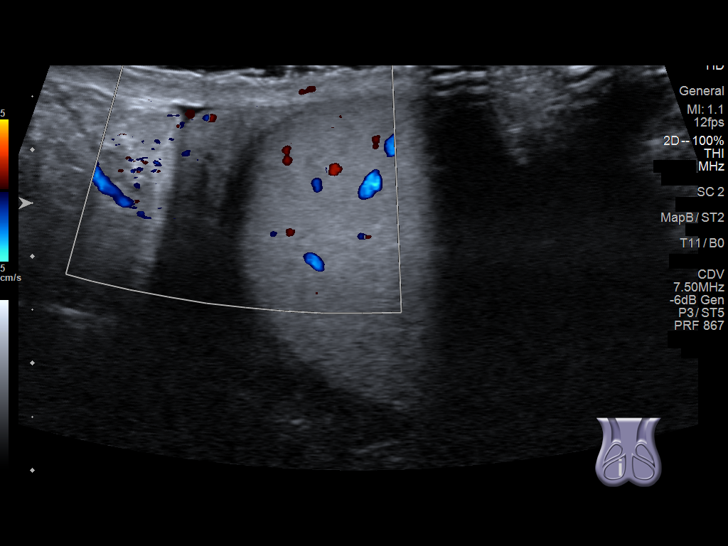
[im 10/56]
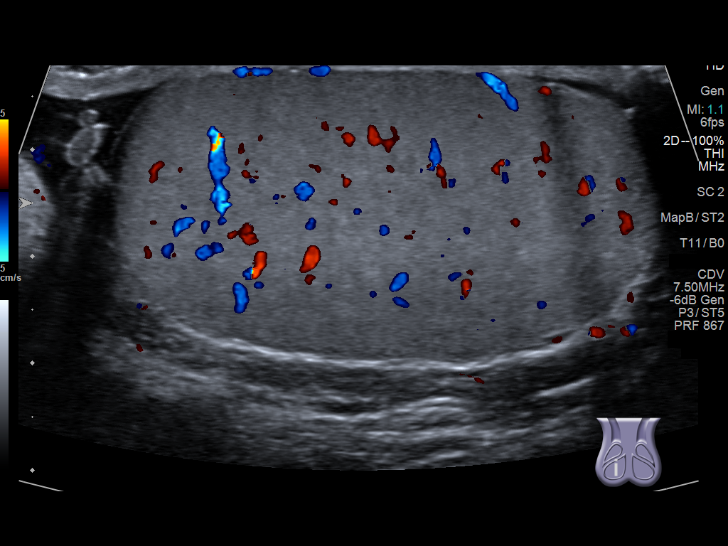
[im 14/56]
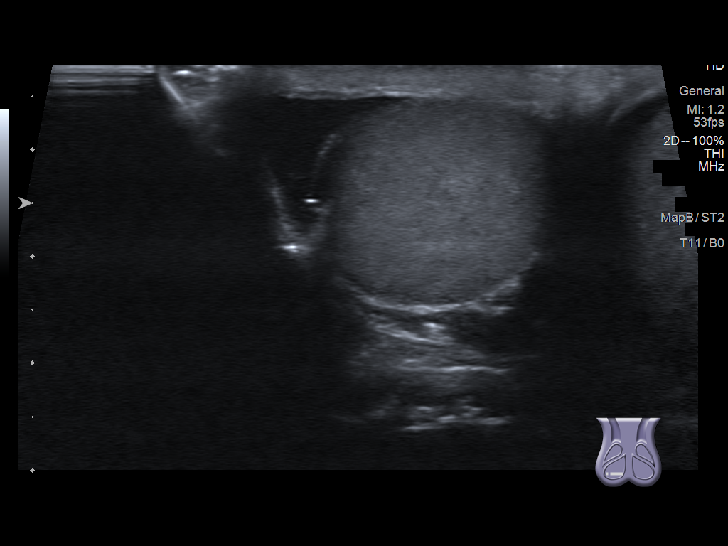
[im 19/56]
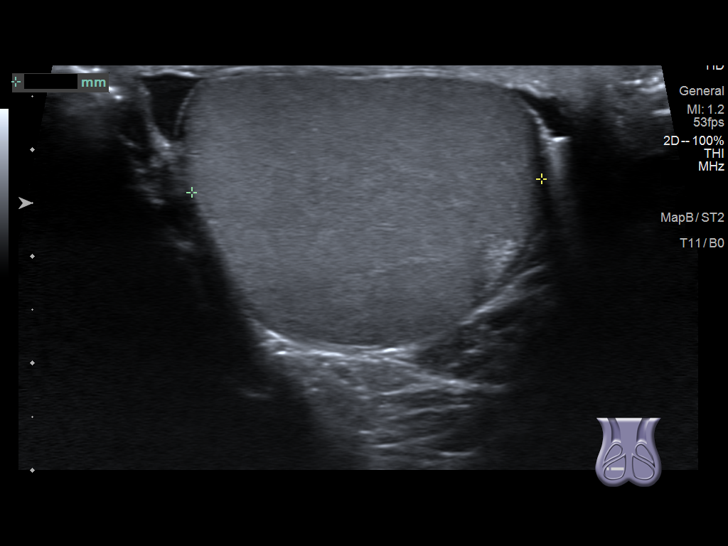
[im 23/56]
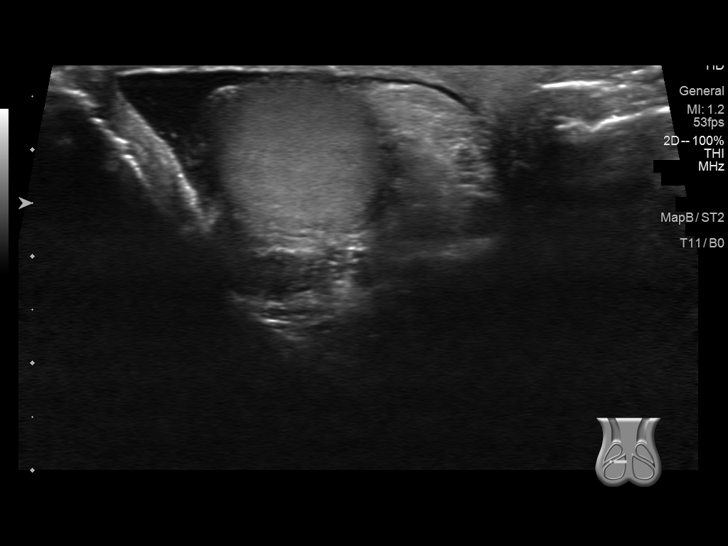
[im 28/56]
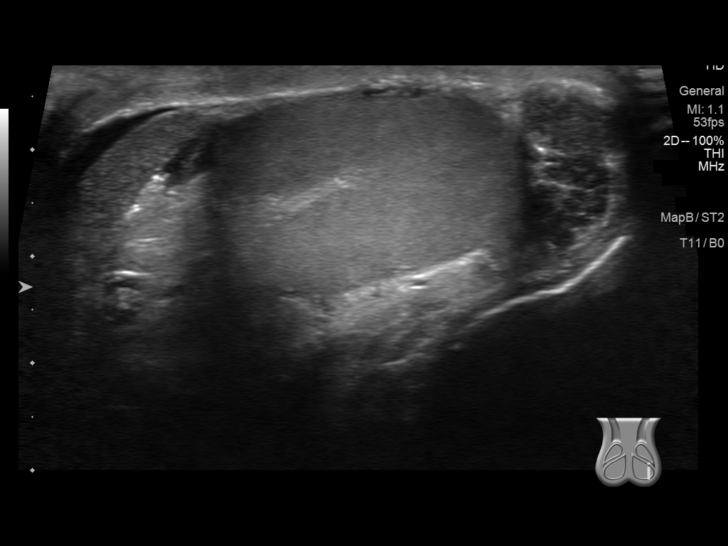
[im 33/56]
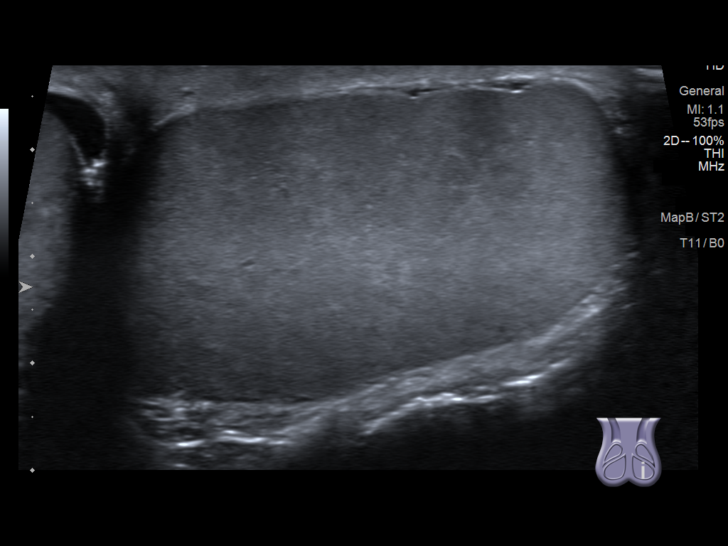
[im 37/56]
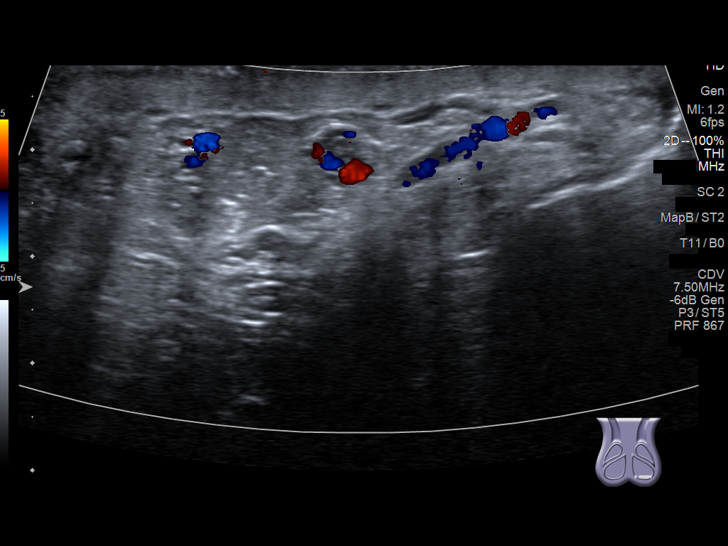
[im 42/56]
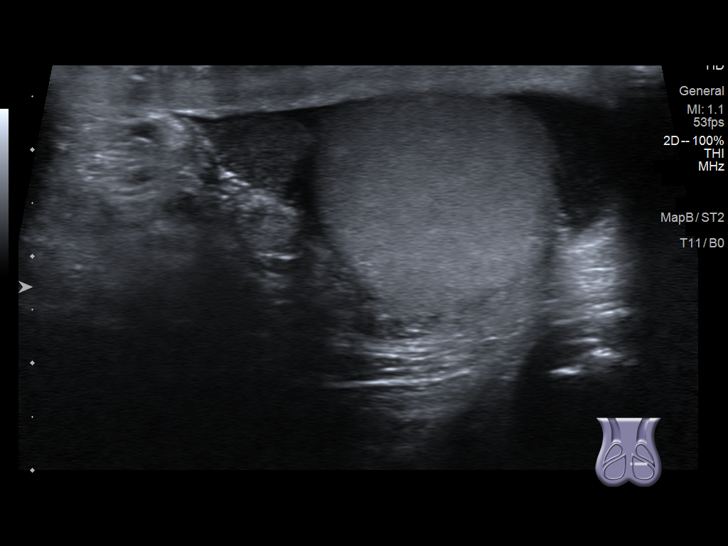
[im 46/56]
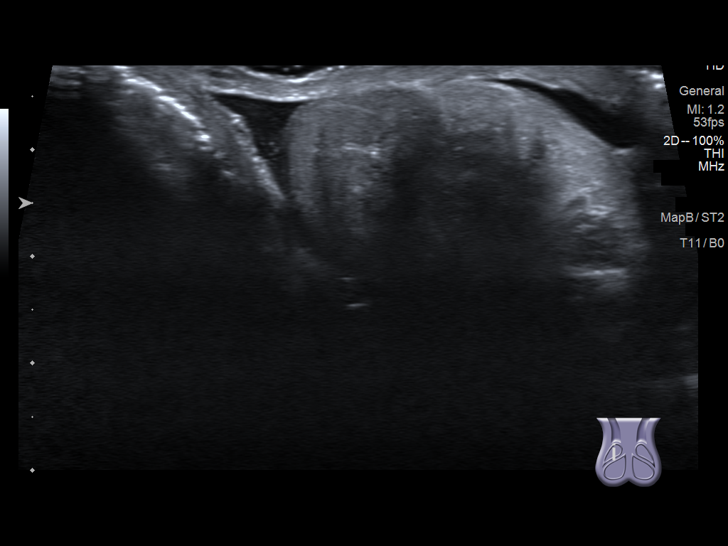
[im 51/56]
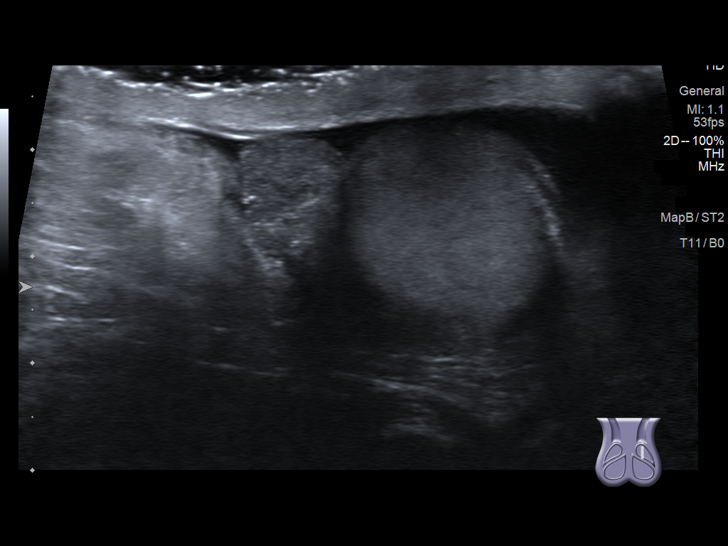
[im 56/56]
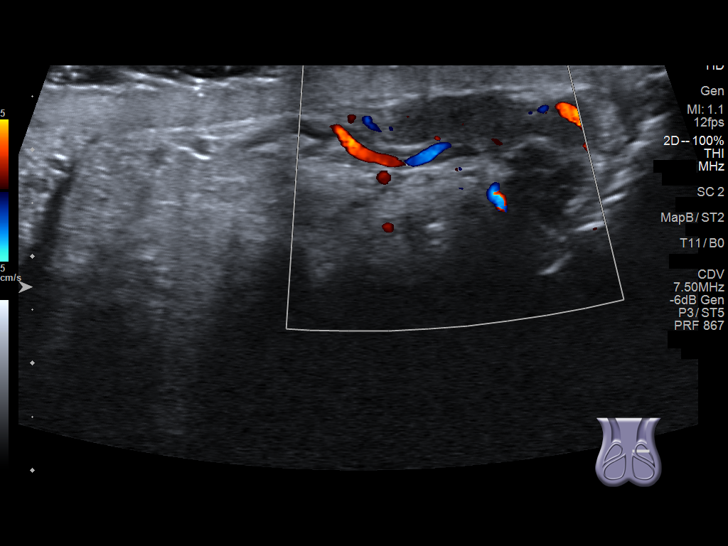

[13 of 25 positions shown; findings below may reference images not displayed]

FINDINGS: Right testicle

Measurements: 5.1 x 2.7 x 3.3 cm. Normal echogenicity without mass
or calcification. Internal blood flow present on color Doppler
imaging.

Left testicle

Measurements: 4.9 x 2.8 x 3.2 cm. Normal echogenicity without mass
or calcification. Internal blood flow present on color Doppler
imaging, symmetric with RIGHT

Right epididymis:  Normal in size and appearance.

Left epididymis:  Normal in size and appearance.

Hydrocele: Minimal BILATERAL hydroceles. Noncalcified body within
the RIGHT hydrocele superior to the RIGHT testis, question appendix
testis, less likely a noncalcified scrotal pearl.

Varicocele: Probable RIGHT varicocele but no LEFT varicocele
identified.
IMPRESSION: Normal testes and epididymi.

Minimal BILATERAL hydroceles.

Question RIGHT varicocele; unilateral RIGHT varicocele is uncommon
and often secondary to another process, such as extrinsic
compression by mass/adenopathy, renal vein thrombosis, portal
hypertension; further evaluation by CT imaging of the abdomen and
pelvis with IV contrast recommended.

## 2021-05-08 DIAGNOSIS — F419 Anxiety disorder, unspecified: Secondary | ICD-10-CM | POA: Diagnosis not present

## 2021-11-10 DIAGNOSIS — R634 Abnormal weight loss: Secondary | ICD-10-CM | POA: Diagnosis not present

## 2021-11-10 DIAGNOSIS — R1032 Left lower quadrant pain: Secondary | ICD-10-CM | POA: Diagnosis not present

## 2021-11-10 DIAGNOSIS — R198 Other specified symptoms and signs involving the digestive system and abdomen: Secondary | ICD-10-CM | POA: Diagnosis not present

## 2021-12-18 DIAGNOSIS — F418 Other specified anxiety disorders: Secondary | ICD-10-CM | POA: Diagnosis not present

## 2022-04-22 DIAGNOSIS — L400 Psoriasis vulgaris: Secondary | ICD-10-CM | POA: Diagnosis not present

## 2022-07-15 DIAGNOSIS — D485 Neoplasm of uncertain behavior of skin: Secondary | ICD-10-CM | POA: Diagnosis not present

## 2022-07-15 DIAGNOSIS — L400 Psoriasis vulgaris: Secondary | ICD-10-CM | POA: Diagnosis not present

## 2022-08-20 DIAGNOSIS — F418 Other specified anxiety disorders: Secondary | ICD-10-CM | POA: Diagnosis not present

## 2022-10-26 DIAGNOSIS — Z113 Encounter for screening for infections with a predominantly sexual mode of transmission: Secondary | ICD-10-CM | POA: Diagnosis not present

## 2022-10-26 DIAGNOSIS — Z0000 Encounter for general adult medical examination without abnormal findings: Secondary | ICD-10-CM | POA: Diagnosis not present

## 2022-11-03 DIAGNOSIS — E559 Vitamin D deficiency, unspecified: Secondary | ICD-10-CM | POA: Diagnosis not present

## 2023-01-10 DIAGNOSIS — D485 Neoplasm of uncertain behavior of skin: Secondary | ICD-10-CM | POA: Diagnosis not present

## 2023-01-10 DIAGNOSIS — D225 Melanocytic nevi of trunk: Secondary | ICD-10-CM | POA: Diagnosis not present

## 2023-01-10 DIAGNOSIS — L400 Psoriasis vulgaris: Secondary | ICD-10-CM | POA: Diagnosis not present

## 2023-01-26 DIAGNOSIS — F418 Other specified anxiety disorders: Secondary | ICD-10-CM | POA: Diagnosis not present

## 2023-01-27 DIAGNOSIS — D485 Neoplasm of uncertain behavior of skin: Secondary | ICD-10-CM | POA: Diagnosis not present

## 2023-02-02 DIAGNOSIS — Z23 Encounter for immunization: Secondary | ICD-10-CM | POA: Diagnosis not present

## 2023-02-02 DIAGNOSIS — S61452A Open bite of left hand, initial encounter: Secondary | ICD-10-CM | POA: Diagnosis not present

## 2023-07-07 DIAGNOSIS — Z6827 Body mass index (BMI) 27.0-27.9, adult: Secondary | ICD-10-CM | POA: Diagnosis not present

## 2023-07-07 DIAGNOSIS — F418 Other specified anxiety disorders: Secondary | ICD-10-CM | POA: Diagnosis not present

## 2023-09-29 DIAGNOSIS — D485 Neoplasm of uncertain behavior of skin: Secondary | ICD-10-CM | POA: Diagnosis not present

## 2023-09-29 DIAGNOSIS — L400 Psoriasis vulgaris: Secondary | ICD-10-CM | POA: Diagnosis not present

## 2023-10-11 DIAGNOSIS — D485 Neoplasm of uncertain behavior of skin: Secondary | ICD-10-CM | POA: Diagnosis not present

## 2023-10-31 DIAGNOSIS — Z0000 Encounter for general adult medical examination without abnormal findings: Secondary | ICD-10-CM | POA: Diagnosis not present

## 2024-01-26 DIAGNOSIS — F418 Other specified anxiety disorders: Secondary | ICD-10-CM | POA: Diagnosis not present

## 2024-03-21 NOTE — Progress Notes (Signed)
 "   Chief Complaint: Right scrotal pain   History of Present Illness: 34 year old male self-referred for evaluation and management of right scrotal pain.  In 2019 he began having right lower abdominal and scrotal pain.  He went through CT abdomen and pelvis as well as a scrotal ultrasound which revealed no significant i intra-abdominal abnormalities, scrotal ultrasound revealed a right varicocele.  His pain eventually abated.  A month or 2 ago began having recurrent right scrotal/testicular pain.  Better lying down, worse with sitting and standing.  This was not associated with any significant change in physical activities.  No change in lower urinary tract pattern, no change with bowel movements.  At its worst, it is a 3-4/10.     Past Medical History:  No past medical history on file.  Past Surgical History:  Past Surgical History:  Procedure Laterality Date   APPENDECTOMY  2013    Allergies:  Allergies[1]  Family History:  Family History  Problem Relation Age of Onset   Lupus Mother     Social History:  Social History[2]  Review of symptoms:  Constitutional:  Negative for unexplained weight loss, night sweats, fever, chills ENT:  Negative for nose bleeds, sinus pain, painful swallowing CV:  Negative for chest pain, shortness of breath, exercise intolerance, palpitations, loss of consciousness Resp:  Negative for cough, wheezing, shortness of breath GI:  Negative for nausea, vomiting, diarrhea, bloody stools GU: Nocturia x 2 Neuro:  Negative for seizures, poor balance, limb weakness, slurred speech Psych:  Negative for lack of energy, depression, anxiety Endocrine:  Negative for polydipsia, polyuria, symptoms of hypoglycemia (dizziness, hunger, sweating) Hematologic:  Negative for anemia, purpura, petechia, prolonged or excessive bleeding, use of anticoagulants  Allergic:  Negative for difficulty breathing or choking as a result of exposure to anything; no shellfish  allergy; no allergic response (rash/itch) to materials, foods  Physical exam: There were no vitals taken for this visit. GENERAL APPEARANCE:  Well appearing, well developed, well nourished, NAD HEENT: Atraumatic, Normocephalic. NECK: Normal appearance LUNGS: Normal inspiratory and expiratory excursion HEART: Regular Rate ABDOMEN: No discrete inguinal hernias but there are bilateral inguinal bulges with Valsalva.  Palpation in this area is reproduces his symptoms. GU: Phallus normal, no lesions. Scrotal skin normal. Testicles/epididymal structures normal. Meatus normal.  I do not detect any varicoceles EXTREMITIES: Moves all extremities well.  Without clubbing, cyanosis, or edema. NEUROLOGIC:  Alert and oriented x 3, normal gait, CN II-XII grossly intact.  MENTAL STATUS:  Appropriate. SKIN:  Warm, dry and intact.    Results:  I have reviewed referring/prior physicians notes  I have reviewed urinalysis--clear  I have reviewed prior imaging CT abdomen/pelvis and scrotal ultrasound images both reviewed   Assessment: 1.  Right inguinal/scrotal pain.  The patient may well have a small inguinal hernia.  Certainly, he does have bilateral inguinal bulges  2.  Nocturia although not terribly bothersome   Plan: 1.  I did let Jaquae know that he may well have hernia.  Not well-demonstrated on exam today, but palpation of this area did not reproduce his pain.  If he does have persistent pain, he will let us  know and I we will refer to general surgery  2.  Reassurance regarding scrotal exam  3.  I let him know that decreasing fluid intake before bedtime may limit his nocturia  4.  Otherwise, return as     [1]  Allergies Allergen Reactions   Loratadine     REACTION: headache  [2]  Social History Tobacco Use   Smoking status: Never   Smokeless tobacco: Never  Substance Use Topics   Alcohol use: No    Alcohol/week: 0.0 standard drinks of alcohol   Drug use: No   "

## 2024-03-26 ENCOUNTER — Ambulatory Visit: Admitting: Urology

## 2024-03-26 VITALS — BP 136/72 | HR 64 | Ht 71.0 in | Wt 206.0 lb

## 2024-03-26 DIAGNOSIS — R351 Nocturia: Secondary | ICD-10-CM

## 2024-03-26 DIAGNOSIS — I861 Scrotal varices: Secondary | ICD-10-CM

## 2024-03-26 DIAGNOSIS — N5082 Scrotal pain: Secondary | ICD-10-CM

## 2024-03-26 LAB — MICROSCOPIC EXAMINATION

## 2024-03-26 LAB — URINALYSIS, ROUTINE W REFLEX MICROSCOPIC
Glucose, UA: NEGATIVE
Leukocytes,UA: NEGATIVE
Nitrite, UA: NEGATIVE
Protein,UA: NEGATIVE
RBC, UA: NEGATIVE
Specific Gravity, UA: 1.02 (ref 1.005–1.030)
Urobilinogen, Ur: 0.2 mg/dL (ref 0.2–1.0)
pH, UA: 5.5 (ref 5.0–7.5)
